# Patient Record
Sex: Female | Born: 1980
Health system: Southern US, Community
[De-identification: ages and names within clinical notes are randomized; demographics above are authoritative.]

## PROBLEM LIST (undated history)

## (undated) DIAGNOSIS — B999 Unspecified infectious disease: Secondary | ICD-10-CM

## (undated) DIAGNOSIS — R87629 Unspecified abnormal cytological findings in specimens from vagina: Secondary | ICD-10-CM

## (undated) DIAGNOSIS — O09529 Supervision of elderly multigravida, unspecified trimester: Secondary | ICD-10-CM

## (undated) HISTORY — PX: WISDOM TOOTH EXTRACTION: SHX21

## (undated) HISTORY — PX: COLPOSCOPY: SHX161

---

## 2003-12-06 ENCOUNTER — Other Ambulatory Visit: Admission: RE | Admit: 2003-12-06 | Discharge: 2003-12-06 | Payer: Self-pay | Admitting: Obstetrics and Gynecology

## 2005-01-07 ENCOUNTER — Other Ambulatory Visit: Admission: RE | Admit: 2005-01-07 | Discharge: 2005-01-07 | Payer: Self-pay | Admitting: Obstetrics & Gynecology

## 2014-11-15 ENCOUNTER — Encounter (HOSPITAL_COMMUNITY): Payer: Self-pay | Admitting: *Deleted

## 2014-11-15 ENCOUNTER — Encounter (HOSPITAL_COMMUNITY)
Admission: AD | Disposition: A | Discharge status: Home / Self Care | Payer: Self-pay | Source: Ambulatory Visit | Attending: Obstetrics and Gynecology

## 2014-11-15 ENCOUNTER — Inpatient Hospital Stay (HOSPITAL_COMMUNITY): Payer: 59

## 2014-11-15 ENCOUNTER — Inpatient Hospital Stay (HOSPITAL_COMMUNITY)
Admission: AD | Admit: 2014-11-15 | Discharge: 2014-11-19 | DRG: 766 | Disposition: A | Discharge status: Home / Self Care | Payer: 59 | Source: Ambulatory Visit | Attending: Obstetrics and Gynecology | Admitting: Obstetrics and Gynecology

## 2014-11-15 DIAGNOSIS — O480 Post-term pregnancy: Principal | ICD-10-CM | POA: Diagnosis present

## 2014-11-15 DIAGNOSIS — Z87891 Personal history of nicotine dependence: Secondary | ICD-10-CM

## 2014-11-15 DIAGNOSIS — Z8249 Family history of ischemic heart disease and other diseases of the circulatory system: Secondary | ICD-10-CM

## 2014-11-15 DIAGNOSIS — Z3A40 40 weeks gestation of pregnancy: Secondary | ICD-10-CM

## 2014-11-15 DIAGNOSIS — Z98891 History of uterine scar from previous surgery: Secondary | ICD-10-CM

## 2014-11-15 DIAGNOSIS — Z825 Family history of asthma and other chronic lower respiratory diseases: Secondary | ICD-10-CM

## 2014-11-15 HISTORY — DX: Unspecified abnormal cytological findings in specimens from vagina: R87.629

## 2014-11-15 HISTORY — DX: Unspecified infectious disease: B99.9

## 2014-11-15 LAB — CBC
HEMATOCRIT: 39 % (ref 36.0–46.0)
Hemoglobin: 13.4 g/dL (ref 12.0–15.0)
MCH: 29.2 pg (ref 26.0–34.0)
MCHC: 34.4 g/dL (ref 30.0–36.0)
MCV: 85 fL (ref 78.0–100.0)
Platelets: 298 10*3/uL (ref 150–400)
RBC: 4.59 MIL/uL (ref 3.87–5.11)
RDW: 14.6 % (ref 11.5–15.5)
WBC: 10.8 10*3/uL — AB (ref 4.0–10.5)

## 2014-11-15 LAB — TYPE AND SCREEN
ABO/RH(D): O POS
ANTIBODY SCREEN: NEGATIVE

## 2014-11-15 LAB — ABO/RH: ABO/RH(D): O POS

## 2014-11-15 SURGERY — Surgical Case
Anesthesia: Spinal

## 2014-11-15 MED ORDER — ZOLPIDEM TARTRATE 5 MG PO TABS: 5.0000 mg | ORAL_TABLET | Freq: Every evening | ORAL | Status: DC | PRN

## 2014-11-15 MED ORDER — SIMETHICONE 80 MG PO CHEW
80.0000 mg | CHEWABLE_TABLET | ORAL | Status: DC | PRN
  Administered 2014-11-17: 80 mg via ORAL
  Filled 2014-11-15: qty 1

## 2014-11-15 MED ORDER — LACTATED RINGERS IV SOLN
INTRAVENOUS | Status: DC | PRN
  Administered 2014-11-15: 18:00:00 via INTRAVENOUS

## 2014-11-15 MED ORDER — PRENATAL MULTIVITAMIN CH
1.0000 | ORAL_TABLET | Freq: Every day | ORAL | Status: DC
  Administered 2014-11-16 – 2014-11-19 (×2): 1 via ORAL
  Filled 2014-11-15 (×4): qty 1

## 2014-11-15 MED ORDER — NALBUPHINE HCL 10 MG/ML IJ SOLN: 5.0000 mg | INTRAMUSCULAR | Status: DC | PRN

## 2014-11-15 MED ORDER — SCOPOLAMINE 1 MG/3DAYS TD PT72
MEDICATED_PATCH | TRANSDERMAL | Status: AC
  Filled 2014-11-15: qty 1

## 2014-11-15 MED ORDER — NALBUPHINE HCL 10 MG/ML IJ SOLN: 5.0000 mg | Freq: Once | INTRAMUSCULAR | Status: DC | PRN

## 2014-11-15 MED ORDER — FAMOTIDINE IN NACL 20-0.9 MG/50ML-% IV SOLN
20.0000 mg | Freq: Once | INTRAVENOUS | Status: AC
  Administered 2014-11-15: 20 mg via INTRAVENOUS

## 2014-11-15 MED ORDER — OXYTOCIN 10 UNIT/ML IJ SOLN
INTRAMUSCULAR | Status: AC
  Filled 2014-11-15: qty 4

## 2014-11-15 MED ORDER — ONDANSETRON HCL 4 MG/2ML IJ SOLN
INTRAMUSCULAR | Status: AC
  Filled 2014-11-15: qty 2

## 2014-11-15 MED ORDER — PHENYLEPHRINE 40 MCG/ML (10ML) SYRINGE FOR IV PUSH (FOR BLOOD PRESSURE SUPPORT)
PREFILLED_SYRINGE | INTRAVENOUS | Status: AC
  Filled 2014-11-15: qty 10

## 2014-11-15 MED ORDER — MEPERIDINE HCL 25 MG/ML IJ SOLN: 6.2500 mg | INTRAMUSCULAR | Status: DC | PRN

## 2014-11-15 MED ORDER — MORPHINE SULFATE (PF) 0.5 MG/ML IJ SOLN
INTRAMUSCULAR | Status: DC | PRN
  Administered 2014-11-15: .2 mg via INTRATHECAL

## 2014-11-15 MED ORDER — NALOXONE HCL 0.4 MG/ML IJ SOLN: 0.4000 mg | INTRAMUSCULAR | Status: DC | PRN

## 2014-11-15 MED ORDER — DIBUCAINE 1 % RE OINT: 1.0000 "application " | TOPICAL_OINTMENT | RECTAL | Status: DC | PRN

## 2014-11-15 MED ORDER — DIPHENHYDRAMINE HCL 25 MG PO CAPS
25.0000 mg | ORAL_CAPSULE | ORAL | Status: DC | PRN
Start: 2014-11-15 — End: 2014-11-19

## 2014-11-15 MED ORDER — CEFAZOLIN SODIUM-DEXTROSE 2-3 GM-% IV SOLR
2.0000 g | INTRAVENOUS | Status: AC
  Administered 2014-11-15: 2 g via INTRAVENOUS

## 2014-11-15 MED ORDER — LANOLIN HYDROUS EX OINT: 1.0000 "application " | TOPICAL_OINTMENT | CUTANEOUS | Status: DC | PRN

## 2014-11-15 MED ORDER — MORPHINE SULFATE (PF) 0.5 MG/ML IJ SOLN: INTRAMUSCULAR | Status: DC | PRN

## 2014-11-15 MED ORDER — PHENYLEPHRINE HCL 10 MG/ML IJ SOLN
INTRAMUSCULAR | Status: DC | PRN
Start: 2014-11-15 — End: 2014-11-15
  Administered 2014-11-15: 40 ug via INTRAVENOUS
  Administered 2014-11-15 (×2): 80 ug via INTRAVENOUS

## 2014-11-15 MED ORDER — ACETAMINOPHEN 325 MG PO TABS: 650.0000 mg | ORAL_TABLET | ORAL | Status: DC | PRN

## 2014-11-15 MED ORDER — KETOROLAC TROMETHAMINE 30 MG/ML IJ SOLN
30.0000 mg | Freq: Four times a day (QID) | INTRAMUSCULAR | Status: DC | PRN
  Administered 2014-11-15: 30 mg via INTRAMUSCULAR

## 2014-11-15 MED ORDER — PROMETHAZINE HCL 25 MG/ML IJ SOLN: 6.2500 mg | INTRAMUSCULAR | Status: DC | PRN

## 2014-11-15 MED ORDER — OXYTOCIN 10 UNIT/ML IJ SOLN
40.0000 [IU] | INTRAVENOUS | Status: DC | PRN
  Administered 2014-11-15: 40 [IU] via INTRAVENOUS

## 2014-11-15 MED ORDER — TETANUS-DIPHTH-ACELL PERTUSSIS 5-2.5-18.5 LF-MCG/0.5 IM SUSP: 0.5000 mL | Freq: Once | INTRAMUSCULAR | Status: DC

## 2014-11-15 MED ORDER — WITCH HAZEL-GLYCERIN EX PADS
1.0000 | MEDICATED_PAD | CUTANEOUS | Status: DC | PRN
Start: 2014-11-15 — End: 2014-11-19

## 2014-11-15 MED ORDER — ACETAMINOPHEN 500 MG PO TABS: 1000.0000 mg | ORAL_TABLET | Freq: Four times a day (QID) | ORAL | Status: AC

## 2014-11-15 MED ORDER — FENTANYL CITRATE (PF) 100 MCG/2ML IJ SOLN
INTRAMUSCULAR | Status: AC
  Filled 2014-11-15: qty 4

## 2014-11-15 MED ORDER — DIPHENHYDRAMINE HCL 50 MG/ML IJ SOLN: 12.5000 mg | INTRAMUSCULAR | Status: DC | PRN

## 2014-11-15 MED ORDER — PHENYLEPHRINE 8 MG IN D5W 100 ML (0.08MG/ML) PREMIX OPTIME
INJECTION | INTRAVENOUS | Status: DC | PRN
  Administered 2014-11-15: 50 ug/min via INTRAVENOUS

## 2014-11-15 MED ORDER — LACTATED RINGERS IV SOLN: INTRAVENOUS | Status: DC

## 2014-11-15 MED ORDER — NALOXONE HCL 1 MG/ML IJ SOLN: 1.0000 ug/kg/h | INTRAVENOUS | Status: DC | PRN

## 2014-11-15 MED ORDER — MORPHINE SULFATE (PF) 0.5 MG/ML IJ SOLN
INTRAMUSCULAR | Status: AC
  Filled 2014-11-15: qty 100

## 2014-11-15 MED ORDER — LACTATED RINGERS IV BOLUS (SEPSIS): 1000.0000 mL | Freq: Once | INTRAVENOUS | Status: DC

## 2014-11-15 MED ORDER — CEFAZOLIN SODIUM-DEXTROSE 2-3 GM-% IV SOLR
INTRAVENOUS | Status: AC
  Filled 2014-11-15: qty 50

## 2014-11-15 MED ORDER — ONDANSETRON HCL 4 MG/2ML IJ SOLN
INTRAMUSCULAR | Status: DC | PRN
  Administered 2014-11-15: 4 mg via INTRAVENOUS

## 2014-11-15 MED ORDER — SCOPOLAMINE 1 MG/3DAYS TD PT72: 1.0000 | MEDICATED_PATCH | Freq: Once | TRANSDERMAL | Status: DC

## 2014-11-15 MED ORDER — ONDANSETRON HCL 4 MG/2ML IJ SOLN: 4.0000 mg | Freq: Three times a day (TID) | INTRAMUSCULAR | Status: DC | PRN

## 2014-11-15 MED ORDER — MAGNESIUM HYDROXIDE 400 MG/5ML PO SUSP: 30.0000 mL | ORAL | Status: DC | PRN

## 2014-11-15 MED ORDER — OXYTOCIN 40 UNITS IN LACTATED RINGERS INFUSION - SIMPLE MED: 62.5000 mL/h | INTRAVENOUS | Status: AC

## 2014-11-15 MED ORDER — SODIUM CHLORIDE 0.9 % IJ SOLN: 3.0000 mL | INTRAMUSCULAR | Status: DC | PRN

## 2014-11-15 MED ORDER — LACTATED RINGERS IV SOLN
INTRAVENOUS | Status: DC
  Administered 2014-11-15 (×2): via INTRAVENOUS

## 2014-11-15 MED ORDER — FENTANYL CITRATE (PF) 100 MCG/2ML IJ SOLN: 25.0000 ug | INTRAMUSCULAR | Status: DC | PRN

## 2014-11-15 MED ORDER — TERBUTALINE SULFATE 1 MG/ML IJ SOLN
0.2500 mg | Freq: Once | INTRAMUSCULAR | Status: AC
  Administered 2014-11-15: 0.25 mg via SUBCUTANEOUS

## 2014-11-15 MED ORDER — OXYCODONE-ACETAMINOPHEN 5-325 MG PO TABS
1.0000 | ORAL_TABLET | ORAL | Status: DC | PRN
  Administered 2014-11-16: 2 via ORAL
  Administered 2014-11-16 (×2): 1 via ORAL
  Administered 2014-11-17: 2 via ORAL
  Administered 2014-11-17: 1 via ORAL
  Administered 2014-11-17 – 2014-11-18 (×2): 2 via ORAL
  Administered 2014-11-18 – 2014-11-19 (×2): 1 via ORAL
  Filled 2014-11-15 (×2): qty 1
  Filled 2014-11-15: qty 2
  Filled 2014-11-15: qty 1
  Filled 2014-11-15: qty 2
  Filled 2014-11-15: qty 1
  Filled 2014-11-15: qty 2
  Filled 2014-11-15: qty 1
  Filled 2014-11-15: qty 2

## 2014-11-15 MED ORDER — MEASLES, MUMPS & RUBELLA VAC ~~LOC~~ INJ: 0.5000 mL | INJECTION | Freq: Once | SUBCUTANEOUS | Status: DC

## 2014-11-15 MED ORDER — FENTANYL CITRATE (PF) 100 MCG/2ML IJ SOLN
INTRAMUSCULAR | Status: DC | PRN
  Administered 2014-11-15: 20 ug via INTRATHECAL

## 2014-11-15 MED ORDER — KETOROLAC TROMETHAMINE 30 MG/ML IJ SOLN: 30.0000 mg | Freq: Four times a day (QID) | INTRAMUSCULAR | Status: DC | PRN

## 2014-11-15 MED ORDER — KETOROLAC TROMETHAMINE 30 MG/ML IJ SOLN
INTRAMUSCULAR | Status: AC
  Administered 2014-11-15: 30 mg via INTRAMUSCULAR
  Filled 2014-11-15: qty 1

## 2014-11-15 MED ORDER — BUPIVACAINE IN DEXTROSE 0.75-8.25 % IT SOLN
INTRATHECAL | Status: DC | PRN
  Administered 2014-11-15: 1.4 mL via INTRATHECAL

## 2014-11-15 MED ORDER — SODIUM CHLORIDE 0.9 % IR SOLN
Status: DC | PRN
  Administered 2014-11-15: 1000 mL

## 2014-11-15 MED ORDER — FENTANYL CITRATE (PF) 100 MCG/2ML IJ SOLN: INTRAMUSCULAR | Status: DC | PRN

## 2014-11-15 MED ORDER — PHENYLEPHRINE 8 MG IN D5W 100 ML (0.08MG/ML) PREMIX OPTIME
INJECTION | INTRAVENOUS | Status: AC
  Filled 2014-11-15: qty 100

## 2014-11-15 MED ORDER — CITRIC ACID-SODIUM CITRATE 334-500 MG/5ML PO SOLN
ORAL | Status: AC
  Administered 2014-11-15: 30 mL via ORAL
  Filled 2014-11-15: qty 15

## 2014-11-15 MED ORDER — CITRIC ACID-SODIUM CITRATE 334-500 MG/5ML PO SOLN
30.0000 mL | Freq: Once | ORAL | Status: AC
  Administered 2014-11-15: 30 mL via ORAL

## 2014-11-15 MED ORDER — TERBUTALINE SULFATE 1 MG/ML IJ SOLN
INTRAMUSCULAR | Status: AC
  Filled 2014-11-15: qty 1

## 2014-11-15 MED ORDER — DIPHENHYDRAMINE HCL 25 MG PO CAPS: 25.0000 mg | ORAL_CAPSULE | Freq: Four times a day (QID) | ORAL | Status: DC | PRN

## 2014-11-15 MED ORDER — SENNOSIDES-DOCUSATE SODIUM 8.6-50 MG PO TABS
2.0000 | ORAL_TABLET | ORAL | Status: DC
  Administered 2014-11-16 – 2014-11-18 (×4): 2 via ORAL
  Filled 2014-11-15 (×4): qty 2

## 2014-11-15 MED ORDER — IBUPROFEN 600 MG PO TABS
600.0000 mg | ORAL_TABLET | Freq: Four times a day (QID) | ORAL | Status: DC
  Administered 2014-11-16 – 2014-11-19 (×15): 600 mg via ORAL
  Filled 2014-11-15 (×15): qty 1

## 2014-11-15 MED ORDER — FAMOTIDINE IN NACL 20-0.9 MG/50ML-% IV SOLN
INTRAVENOUS | Status: AC
  Administered 2014-11-15: 20 mg via INTRAVENOUS
  Filled 2014-11-15: qty 50

## 2014-11-15 MED ORDER — SCOPOLAMINE 1 MG/3DAYS TD PT72
MEDICATED_PATCH | TRANSDERMAL | Status: DC | PRN
  Administered 2014-11-15: 1 via TRANSDERMAL

## 2014-11-15 MED ORDER — MENTHOL 3 MG MT LOZG: 1.0000 | LOZENGE | OROMUCOSAL | Status: DC | PRN

## 2014-11-15 SURGICAL SUPPLY — 39 items
APL SKNCLS STERI-STRIP NONHPOA (GAUZE/BANDAGES/DRESSINGS) ×1
BENZOIN TINCTURE PRP APPL 2/3 (GAUZE/BANDAGES/DRESSINGS) ×2 IMPLANT
CLAMP CORD UMBIL (MISCELLANEOUS) IMPLANT
CLOSURE STERI STRIP 1/2 X4 (GAUZE/BANDAGES/DRESSINGS) ×2 IMPLANT
CLOTH BEACON ORANGE TIMEOUT ST (SAFETY) ×3 IMPLANT
CONTAINER PREFILL 10% NBF 15ML (MISCELLANEOUS) IMPLANT
DRAPE SHEET LG 3/4 BI-LAMINATE (DRAPES) IMPLANT
DRSG OPSITE POSTOP 4X10 (GAUZE/BANDAGES/DRESSINGS) ×3 IMPLANT
DURAPREP 26ML APPLICATOR (WOUND CARE) ×3 IMPLANT
ELECT REM PT RETURN 9FT ADLT (ELECTROSURGICAL) ×3
ELECTRODE REM PT RTRN 9FT ADLT (ELECTROSURGICAL) ×1 IMPLANT
EXTRACTOR VACUUM KIWI (MISCELLANEOUS) IMPLANT
EXTRACTOR VACUUM M CUP 4 TUBE (SUCTIONS) IMPLANT
EXTRACTOR VACUUM M CUP 4' TUBE (SUCTIONS)
GLOVE ORTHO TXT STRL SZ7.5 (GLOVE) ×3 IMPLANT
GOWN STRL REUS W/TWL LRG LVL3 (GOWN DISPOSABLE) ×6 IMPLANT
KIT ABG SYR 3ML LUER SLIP (SYRINGE) IMPLANT
NEEDLE HYPO 25X5/8 SAFETYGLIDE (NEEDLE) ×3 IMPLANT
NS IRRIG 1000ML POUR BTL (IV SOLUTION) ×3 IMPLANT
PACK C SECTION WH (CUSTOM PROCEDURE TRAY) ×3 IMPLANT
PAD OB MATERNITY 4.3X12.25 (PERSONAL CARE ITEMS) ×3 IMPLANT
PENCIL SMOKE EVAC W/HOLSTER (ELECTROSURGICAL) ×3 IMPLANT
RETRACTOR WND ALEXIS 25 LRG (MISCELLANEOUS) IMPLANT
RTRCTR C-SECT PINK 25CM LRG (MISCELLANEOUS) ×3 IMPLANT
RTRCTR WOUND ALEXIS 25CM LRG (MISCELLANEOUS) ×3
STRIP CLOSURE SKIN 1/2X4 (GAUZE/BANDAGES/DRESSINGS) ×1 IMPLANT
SUT CHROMIC 1 CTX 36 (SUTURE) ×6 IMPLANT
SUT PLAIN 0 NONE (SUTURE) IMPLANT
SUT PLAIN 2 0 (SUTURE) ×3
SUT PLAIN 2 0 XLH (SUTURE) IMPLANT
SUT PLAIN ABS 2-0 CT1 27XMFL (SUTURE) IMPLANT
SUT VIC AB 0 CT1 27 (SUTURE) ×6
SUT VIC AB 0 CT1 27XBRD ANBCTR (SUTURE) ×2 IMPLANT
SUT VIC AB 2-0 CT1 (SUTURE) ×3 IMPLANT
SUT VIC AB 2-0 CT1 27 (SUTURE) ×3
SUT VIC AB 2-0 CT1 TAPERPNT 27 (SUTURE) ×1 IMPLANT
SUT VIC AB 4-0 KS 27 (SUTURE) ×3 IMPLANT
TOWEL OR 17X24 6PK STRL BLUE (TOWEL DISPOSABLE) ×3 IMPLANT
TRAY FOLEY CATH SILVER 14FR (SET/KITS/TRAYS/PACK) ×3 IMPLANT

## 2014-11-15 NOTE — H&P (Signed)
Joseph C JUNKTora KindredR is a 34 y.o. female, G1P0, EGA [redacted] weeks with EDC 10-6 presenting for eval of FHR decels in the ovviced.  On eval in MAU, irreg ctx, variable, late and a prolonged decel.  Prenatal care uncomplicated.  Maternal Medical History:  Reason for admission: Contractions.   Contractions: Frequency: irregular.   Perceived severity is moderate.    Fetal activity: Perceived fetal activity is normal.    Prenatal complications: no prenatal complications   OB History    Gravida Para Term Preterm AB TAB SAB Ectopic Multiple Living   1              Past Medical History  Diagnosis Date  . Infection     UTI  . Vaginal Pap smear, abnormal     colpo, ok since   Past Surgical History  Procedure Laterality Date  . No past surgeries    . Colposcopy     Family History: family history includes Asthma in her mother; Heart disease in her maternal grandfather and maternal grandmother. Social History:  reports that she has quit smoking. Her smoking use included Cigarettes. She has never used smokeless tobacco. She reports that she drinks alcohol. She reports that she does not use illicit drugs.   Prenatal Transfer Tool  Maternal Diabetes: No Genetic Screening: Normal Maternal Ultrasounds/Referrals: Normal Fetal Ultrasounds or other Referrals:  None Maternal Substance Abuse:  No Significant Maternal Medications:  None Significant Maternal Lab Results:  Lab values include: Group B Strep negative Other Comments:  None  Review of Systems  Respiratory: Negative.   Cardiovascular: Negative.       Blood pressure 108/75, pulse 84, temperature 97.8 F (36.6 C), temperature source Oral, resp. rate 18, height 5\' 3"  (1.6 m), weight 82.192 kg (181 lb 3.2 oz), SpO2 100 %. Maternal Exam:  Uterine Assessment: Contraction strength is mild.  Contraction frequency is irregular.   Abdomen: Patient reports no abdominal tenderness. Fetal presentation: vertex  Introitus: Normal vulva. Normal  vagina.    Fetal Exam Fetal Monitor Review: Mode: ultrasound.   Baseline rate: 150.  Variability: moderate (6-25 bpm).   Pattern: variable decelerations, late decelerations and prolonged decelerations.    Fetal State Assessment: Category II - tracings are indeterminate.     Physical Exam  Vitals reviewed. Constitutional: She appears well-developed and well-nourished.  Cardiovascular: Normal rate, regular rhythm and normal heart sounds.   No murmur heard. Respiratory: Effort normal and breath sounds normal. No respiratory distress. She has no wheezes.  GI: Soft.    Prenatal labs: ABO, Rh:  O pos Antibody:  neg Rubella:  Immune RPR:   NR HBsAg:   Neg HIV:   NR GBS:   Neg  Assessment/Plan: IUP at 41 weeks with persistent FHR decels, Cat II, remote from delivery.  Discussed situation, discussed c-section procedure and risks.  Will proceed with urgent c-section, OR is aware.   Bayan Hedstrom D 11/15/2014, 4:52 PM

## 2014-11-15 NOTE — MAU Note (Signed)
Sent from office, had 2 decels there. Had some dry blood  Earlier, small amt of mucous.

## 2014-11-15 NOTE — MAU Provider Note (Signed)
Chief Complaint:  decls in FH in office    First Provider Initiated Contact with Patient 11/15/14 1649      HPI: Wendy Harding is a 34 y.o. G1P0 at [redacted]w[redacted]d who was sent to maternity admissions for FHR monitoring. Had 2 decels in office. NST performed for decreased fetal movement. Sent to [facility] for direct admit to [facility], but the is not a bed available.   Mild contractions. Denies leakage of fluid or vaginal bleeding.  Pregnancy course: Ultrasound performed for measuring size less than dates at 38 weeks. Estimated fetal weight 20th percentile. AFI 9 cm.   Past Medical History: Past Medical History  Diagnosis Date  . Infection     UTI  . Vaginal Pap smear, abnormal     colpo, ok since    Past obstetric history: OB History  Gravida Para Term Preterm AB SAB TAB Ectopic Multiple Living  1             # Outcome Date GA Lbr Len/2nd Weight Sex Delivery Anes PTL Lv  1 Current               Past Surgical History: Past Surgical History  Procedure Laterality Date  . No past surgeries    . Colposcopy       Family History: Family History  Problem Relation Age of Onset  . Asthma Mother   . Heart disease Maternal Grandmother   . Heart disease Maternal Grandfather     Social History: Social History  Substance Use Topics  . Smoking status: Former Smoker    Types: Cigarettes  . Smokeless tobacco: Never Used     Comment: 2014  . Alcohol Use: Yes     Comment: none with preg    Allergies: No Known Allergies  Meds:  Prescriptions prior to admission  Medication Sig Dispense Refill Last Dose  . Prenatal Vit-Fe Fumarate-FA (PRENATAL MULTIVITAMIN) TABS tablet Take 1 tablet by mouth daily at 12 noon.   11/15/2014 at Unknown time    I have reviewed patient's Past Medical Hx, Surgical Hx, Family Hx, Social Hx, medications and allergies.   ROS:  Review of Systems  Gastrointestinal: Positive for abdominal pain (mild contractions only).  Genitourinary: Negative for vaginal  bleeding and vaginal discharge.    Physical Exam   Patient Vitals for the past 24 hrs:  BP Temp Temp src Pulse Resp SpO2 Height Weight  11/15/14 1631 108/75 mmHg 97.8 F (36.6 C) Oral 84 18 100 %  (1.6 m) 181 lb 3.2 oz (82.192 kg)   Constitutional: Well-developed, well-nourished female in no acute distress.  Cardiovascular: normal rate Respiratory: normal effort GI: Abd soft, non-tender, gravid appropriate for gestational age. Palpate soft between contractions. Neurologic: Alert and oriented x 4.  GU: Cervix 1/long/-3. Scant brown mucus on glove.    FHT:  Baseline 150 , min variability, accelerations present, mild variable decelerations Contractions: q 4 mins   Labs: Results for orders placed or performed during the hospital encounter of 11/15/14 (from the past 24 hour(s))  CBC     Status: Abnormal   Collection Time: 11/15/14  4:30 PM  Result Value Ref Range   WBC 10.8 (H) 4.0 - 10.5 K/uL   RBC 4.59 3.87 - 5.11 MIL/uL   Hemoglobin 13.4 12.0 - 15.0 g/dL   HCT 16.1 09.6 - 04.5 %   MCV 85.0 78.0 - 100.0 fL   MCH 29.2 26.0 - 34.0 pg   MCHC 34.4 30.0 - 36.0 g/dL  RDW 14.6 11.5 - 15.5 %   Platelets 298 150 - 400 K/uL  Type and screen [facility]     Status: None (Preliminary result)   Collection Time: 11/15/14  4:30 PM  Result Value Ref Range   ABO/RH(D) O POS    Antibody Screen PENDING    Sample Expiration 11/18/2014     Imaging:  No results found.  MAU Course: Prolonged deceleration. IV bolus, multiple position changes, oxygen, terbutaline given. Dr. Jackelyn KnifeMeisinger and Dr. Macon LargeAnyanwu called. Dr. Jackelyn KnifeMeisinger coming to hospital. Dr. Macon LargeAnyanwu bedside in case stat C-section needed, but fetal heart rate was back up to 90s and continued to rise. Dr. Jackelyn KnifeMeisinger bedside soon after.  MDM: 34 year old female at 40 weeks and 6 days with multiple decelerations including 17 minute prolonged decel. Patient remote from delivery.   Assessment: Fetal heart rate  decelerations  Plan: Prep OR for urgent C-section.  Rochester[place]ia Grabiela Harding, CNM 11/15/2014 5:05 PM

## 2014-11-15 NOTE — Transfer of Care (Signed)
Immediate Anesthesia Transfer of Care Note  Patient: Wendy Harding  Procedure(s) Performed: Procedure(s): CESAREAN SECTION (N/A)  Patient Location: PACU  Anesthesia Type:Spinal  Level of Consciousness: awake, alert  and patient cooperative  Airway & Oxygen Therapy: Patient Spontanous Breathing  Post-op Assessment: Report given to RN and Post -op Vital signs reviewed and stable  Post vital signs: Reviewed and stable  Last Vitals:  Filed Vitals:   11/15/14 1631  BP: 108/75  Pulse: 84  Temp: 36.6 C  Resp: 18    Complications: No apparent anesthesia complications

## 2014-11-15 NOTE — Op Note (Signed)
Preoperative diagnosis: Intrauterine pregnancy at 41 weeks, FHR decels Postoperative diagnosis: Same, thick meconium Procedure: Primary low transverse cesarean section without extensions Surgeon: Lavina Hammanodd Savvy Peeters M.D. Anesthesia: Spinal  Findings: Patient had normal gravid anatomy and delivered a viable female infant with Apgars and weight pending, arterial cord pH pending Estimated blood loss: 800 cc Specimens: Placenta sent for routine pathology Complications: None  Procedure in detail: The patient was taken to the operating room and placed in the sitting position. Dr. Berneice HeinrichManny instilled spinal anesthesia.  She was then placed in the dorsosupine position with left tilt. Abdomen was then prepped and draped in the usual sterile fashion, and a foley catheter was inserted. The level of her anesthesia was found to be adequate. Abdomen was entered via a standard Pfannenstiel incision. Once the peritoneal cavity was entered the Alexis disposable self-retaining retractor was placed and good visualization was achieved. A 4 cm transverse incision was then made in the lower uterine segment pushing the bladder inferior. Once the uterine cavity was entered the incision was extended digitally. Membranes were ruptured revealing thick meconium.  The fetal vertex was grasped and delivered through the incision atraumatically, nuchal cord x 1 reduced. Mouth and nares were suctioned. The remainder of the infant then delivered atraumatically. Cord was doubly clamped and cut and the infant handed to the awaiting pediatric team. Cord blood and arterial cord pH were obtained. The placenta delivered spontaneously. Uterus was wiped dry with clean lap pad and all clots and debris were removed. Uterine incision was inspected and found to be free of extensions. Uterine incision was closed in 2 layers with running locking #1 Chromic for the first layer, an imbricating layer with #1 Chromic for the second layer. Tubes and ovaries were  inspected and found to be normal. Uterine incision was inspected and found to be hemostatic. Bleeding from serosal edges was controlled with electrocautery. The Alexis retractor was removed. Subfascial space was irrigated and made hemostatic with electrocautery. Peritoneum was closed with 2-0 Vicryl.  Fascia was closed in running fashion starting at both ends and meeting in the middle with 0 Vicryl. Subcutaneous tissue was then irrigated and made hemostatic with electrocautery, then closed with running 2-0 plain gut. Skin was closed with running 4-0 Vicryl subcuticular suture followed by steri-strips and a sterile dressing. Patient tolerated the procedure well and was taken to the recovery in stable condition. Counts were correct x2, she received Ancef 2 g IV at the beginning of the procedure and she had PAS hose on throughout the procedure.

## 2014-11-15 NOTE — Anesthesia Preprocedure Evaluation (Signed)
Anesthesia Evaluation  Patient identified by MRN, date of birth, ID band Patient awake and Patient confused    Reviewed: Allergy & Precautions, H&P , NPO status , Patient's Chart, lab work & pertinent test results  Airway Mallampati: II       Dental   Pulmonary former smoker,    Pulmonary exam normal breath sounds clear to auscultation       Cardiovascular Exercise Tolerance: Good Normal cardiovascular exam Rhythm:regular Rate:Normal     Neuro/Psych    GI/Hepatic   Endo/Other    Renal/GU      Musculoskeletal   Abdominal   Peds  Hematology   Anesthesia Other Findings   Reproductive/Obstetrics (+) Pregnancy                             Anesthesia Physical Anesthesia Plan  ASA: II and emergent  Anesthesia Plan: Spinal   Post-op Pain Management:    Induction:   Airway Management Planned:   Additional Equipment:   Intra-op Plan:   Post-operative Plan:   Informed Consent:   Plan Discussed with:   Anesthesia Plan Comments:         Anesthesia Quick Evaluation

## 2014-11-15 NOTE — Anesthesia Postprocedure Evaluation (Signed)
  Anesthesia Post-op Note  Patient: Wendy Harding  Procedure(s) Performed: Procedure(s): CESAREAN SECTION (N/A)  Patient Location: PACU  Anesthesia Type:Spinal  Level of Consciousness: awake and alert   Airway and Oxygen Therapy: Patient Spontanous Breathing  Post-op Pain: mild  Post-op Assessment: Post-op Vital signs reviewed, Patient's Cardiovascular Status Stable, Respiratory Function Stable, Patent Airway and No signs of Nausea or vomiting              Post-op Vital Signs: Reviewed and stable  Last Vitals:  Filed Vitals:   11/15/14 1900  BP: 106/55  Pulse: 73  Temp:   Resp: 17    Complications: No apparent anesthesia complications

## 2014-11-15 NOTE — Anesthesia Procedure Notes (Addendum)
Spinal Patient location during procedure: OR Start time: 11/15/2014 5:15 PM End time: 11/15/2014 5:26 PM Staffing Anesthesiologist: Sebastian AcheMANNY, Juliane Guest Preanesthetic Checklist Completed: patient identified, site marked, surgical consent, pre-op evaluation, timeout performed, IV checked, risks and benefits discussed and monitors and equipment checked Spinal Block Patient position: sitting Prep: site prepped and draped and DuraPrep Patient monitoring: heart rate, continuous pulse ox and blood pressure Approach: midline Location: L3-4 Injection technique: single-shot Needle Needle type: Pencil-Tip  Needle gauge: 24 G Needle length: 10 cm Needle insertion depth: 6 cm Assessment Sensory level: T4 Additional Notes No complications

## 2014-11-16 ENCOUNTER — Encounter (HOSPITAL_COMMUNITY): Payer: Self-pay | Admitting: Obstetrics and Gynecology

## 2014-11-16 LAB — CBC
HCT: 32.9 % — ABNORMAL LOW (ref 36.0–46.0)
HEMOGLOBIN: 11.1 g/dL — AB (ref 12.0–15.0)
MCH: 28.5 pg (ref 26.0–34.0)
MCHC: 33.7 g/dL (ref 30.0–36.0)
MCV: 84.6 fL (ref 78.0–100.0)
PLATELETS: 257 10*3/uL (ref 150–400)
RBC: 3.89 MIL/uL (ref 3.87–5.11)
RDW: 14.7 % (ref 11.5–15.5)
WBC: 10.5 10*3/uL (ref 4.0–10.5)

## 2014-11-16 LAB — RPR: RPR Ser Ql: NONREACTIVE

## 2014-11-16 NOTE — Lactation Note (Signed)
This note was copied from the chart of Wendy Prescilla Soursmanda Sebesta. Lactation Consultation Note  Patient Name: Wendy Harding JXBJY'NToday's Date: 11/16/2014 Reason for consult: Initial assessment;NICU baby;Hyperbilirubinemia Baby transferred to NICU for hyperbilirubinemia. Mom has been set up with DEBP. Advised to pump every 3 hours for 15 minutes on preemie setting, followed by 5 minutes of hand expression. Breast milk storage reviewed with Mom. NICU booklet and Lactation brochure left for review. Advised of OP services and support group. Demonstrated hand expression at this visit, no colostrum obtained at this visit.   Maternal Data Has patient been taught Hand Expression?: Yes Does the patient have breastfeeding experience prior to this delivery?: No  Feeding Feeding Type: Bottle Fed - Formula Nipple Type: Slow - flow Length of feed: 10 min  LATCH Score/Interventions                      Lactation Tools Discussed/Used Tools: Pump Breast pump type: Double-Electric Breast Pump WIC Program: No   Consult Status Consult Status: Follow-up Date: 11/17/14 Follow-up type: In-patient    Wendy Harding, Wendy Harding 11/16/2014, 6:10 PM

## 2014-11-16 NOTE — Progress Notes (Signed)
Subjective: Postpartum Day #1: Cesarean Delivery Patient reports incisional pain and tolerating PO.    Objective: Vital signs in last 24 hours: Temp:  [95.8 F (35.4 C)-98.5 F (36.9 C)] 98.2 F (36.8 C) (10/14 0411) Pulse Rate:  [53-84] 57 (10/14 0421) Resp:  [16-29] 18 (10/14 0413) BP: (81-111)/(54-75) 88/57 mmHg (10/14 0421) SpO2:  [93 %-100 %] 97 % (10/14 0411) Weight:  [82.192 kg (181 lb 3.2 oz)] 82.192 kg (181 lb 3.2 oz) (10/13 1631)  Physical Exam:  General: alert Lochia: appropriate Uterine Fundus: firm Incision: dressing C/D/I   Recent Labs  11/15/14 1630 11/16/14 0535  HGB 13.4 11.1*  HCT 39.0 32.9*    Assessment/Plan: Status post Cesarean section. Doing well postoperatively.  Continue current care, ambulate, d/c foley.  Maridel Pixler D 11/16/2014, 8:08 AM

## 2014-11-16 NOTE — Anesthesia Postprocedure Evaluation (Signed)
  Anesthesia Post-op Note  Patient: Wendy Harding  Procedure(s) Performed: Procedure(s): CESAREAN SECTION (N/A)  Patient Location: Mother/Baby  Anesthesia Type:Spinal  Level of Consciousness: awake  Airway and Oxygen Therapy: Patient Spontanous Breathing  Post-op Pain: mild  Post-op Assessment: Patient's Cardiovascular Status Stable and Respiratory Function Stable              Post-op Vital Signs: stable  Last Vitals:  Filed Vitals:   11/16/14 0810  BP: 87/38  Pulse: 59  Temp: 37.1 C  Resp: 20    Complications: No apparent anesthesia complications

## 2014-11-16 NOTE — Addendum Note (Signed)
Addendum  created 11/16/14 0954 by Renford DillsJanet L Deonte Otting, CRNA   Modules edited: Notes Section   Notes Section:  File: [id number]

## 2014-11-17 NOTE — Progress Notes (Signed)
Subjective: Postpartum Day 2: Cesarean Delivery Patient reports incisional pain, tolerating PO and no problems voiding.    Objective: Vital signs in last 24 hours: Temp:  [97.8 F (36.6 C)-98.4 F (36.9 C)] 97.8 F (36.6 C) (10/15 0510) Pulse Rate:  [61-67] 67 (10/15 0510) Resp:  [16-18] 18 (10/15 0510) BP: (101-104)/(48-64) 102/64 mmHg (10/15 0510) SpO2:  [97 %-98 %] 98 % (10/14 1705)  Physical Exam:  General: alert and cooperative Lochia: appropriate Uterine Fundus: firm Incision: C/D/I    Recent Labs  11/15/14 1630 11/16/14 0535  HGB 13.4 11.1*  HCT 39.0 32.9*    Assessment/Plan: Status post Cesarean section. Doing well postoperatively.  Continue current care. Baby went to NICU for elevated bilirubin levels, but those are improving on triple phototherapy and IV hydration  Thao Vanover W 11/17/2014, 8:34 AM

## 2014-11-17 NOTE — Lactation Note (Signed)
This note was copied from the chart of Wendy Prescilla Soursmanda Obriant. Lactation Consultation Note  Patient Name: Wendy Prescilla Soursmanda Sarwar RUEAV'WToday's Date: 11/17/2014 Reason for consult: Follow-up assessment;NICU baby;Hyperbilirubinemia  Baby in NICU on Double Photo. Per mom has pumped with only drops as yield.  Mom expressed she was very tired. LC recommended getting a good nap and rest,  Then visit the baby , ask if skin to skin is a possibility, with photo tx blanket underneath  Baby and on top. Skin to skin will help moms milk supply and let down .     Maternal Data    Feeding Feeding Type: Formula Nipple Type: Slow - flow Length of feed: 30 min  LATCH Score/Interventions                Intervention(s): Breastfeeding basics reviewed     Lactation Tools Discussed/Used Tools: Pump Breast pump type: Double-Electric Breast Pump Pump Review:  (pump already set up )   Consult Status Consult Status: Follow-up Date: 11/25/14 Follow-up type: In-patient    Kathrin Greathouseorio, Arlis Yale Ann 11/17/2014, 4:29 PM

## 2014-11-18 NOTE — Progress Notes (Signed)
Subjective: Postpartum Day 3 Cesarean Delivery Patient reports tolerating PO and no problems voiding.    Objective: Vital signs in last 24 hours: Temp:  [98.3 F (36.8 C)] 98.3 F (36.8 C) (10/15 1800) Pulse Rate:  [62] 62 (10/15 1800) Resp:  [20] 20 (10/15 1800) BP: (105)/(66) 105/66 mmHg (10/15 1800) SpO2:  [98 %] 98 % (10/15 1800)  Physical Exam:  General: alert and cooperative Lochia: appropriate Uterine Fundus: firm Incision: C/D/I    Recent Labs  11/15/14 1630 11/16/14 0535  HGB 13.4 11.1*  HCT 39.0 32.9*    Assessment/Plan: Status post Cesarean section. Doing well postoperatively.  Continue current care, will stay until tomorrow since baby rapidly improving in NICU from hyperbilirubinemia, may be ready to go home in 1-2 more days. She does eventually want circumcision when baby ready  Lashai Grosch W 11/18/2014, 10:55 AM

## 2014-11-18 NOTE — Lactation Note (Signed)
This note was copied from the chart of Wendy Prescilla Soursmanda Kelch. Lactation Consultation Note  Patient Name: Wendy Harding WUJWJ'XToday's Date: 11/18/2014 Reason for consult: Follow-up assessment;NICU baby;Other (Comment) (per mom milk is in )  Per mom will be D/C tomorrow. Milk is in pumping off 20 ml this am , and ice when needing it. LC reviewed sore nipple and engorgement prevention and tx.  LC also encouraged mom rest and nap to enhance let down. Mom using the DEBP .  Per mom [facility] Healthy [facility] program today or tomorrow.    Maternal Data    Feeding Feeding Type: Formula Nipple Type: Slow - flow Length of feed: 30 min  LATCH Score/Interventions                Intervention(s): Breastfeeding basics reviewed     Lactation Tools Discussed/Used Tools: Pump Breast pump type: Double-Electric Breast Pump WIC Program: No   Consult Status Consult Status: Follow-up Date: 11/19/14 Follow-up type: In-patient    Kathrin Greathouseorio, Prem Coykendall Ann 11/18/2014, 11:46 AM

## 2014-11-18 NOTE — Discharge Summary (Signed)
Obstetric Discharge Summary Reason for Admission: cesarean section for category 2 Tracing remote from delivery Prenatal Procedures: NST Intrapartum Procedures: cesarean: low cervical, transverse Postpartum Procedures: none Complications-Operative and Postpartum: none HEMOGLOBIN  Date Value Ref Range Status  11/16/2014 11.1* 12.0 - 15.0 g/dL Final   HCT  Date Value Ref Range Status  11/16/2014 32.9* 36.0 - 46.0 % Final    Physical Exam:  General: alert and cooperative Lochia: appropriate Uterine Fundus: firm Incision: C/D/I   Discharge Diagnoses: Term Pregnancy-delivered                                          Category 2 tracing                                          Thick meconium stained fluid  Discharge Information: Date: 11/19/2014 Activity: pelvic rest Diet: routine Medications: Ibuprofen and Percocet Condition: improved Instructions: refer to practice specific booklet Discharge to: home Follow-up Information    Follow up with MEISINGER,TODD D, MD In 2 weeks.   Specialty:  Obstetrics and Gynecology   Why:  incision check   Contact information:   [address], [address] [place] [phone]       Newborn Data: Live born female  Birth Weight: 6 lb 12.6 oz (3080 g) APGAR: 8, 9  Stable in NICU  Wendy Harding 11/19/2014, 8:29 AM

## 2014-11-19 MED ORDER — OXYCODONE-ACETAMINOPHEN 5-325 MG PO TABS: 1.0000 | ORAL_TABLET | ORAL | Status: DC | PRN

## 2014-11-19 MED ORDER — IBUPROFEN 600 MG PO TABS: 600.0000 mg | ORAL_TABLET | Freq: Four times a day (QID) | ORAL | Status: DC

## 2014-11-19 NOTE — Lactation Note (Signed)
This note was copied from the chart of Wendy Prescilla Soursmanda Kruger. Lactation Consultation Note  Patient Name: Wendy Harding NWGNF'AToday's Date: 11/19/2014 Reason for consult: Follow-up assessment;NICU baby NICU baby 5090 hours old. Mom using DEBP when this LC entered the room, but mom only pumping one breast. Mom states that she is needing to massage breast while pumping. Enc mom to convert a sports bra for hands-free pumping in order to massage both breast simultaneously while pumping. Discussed rationale for pumping both breast at same time. Mom reports that baby is being D/C'd with mom today. Discussed ways of knowing that baby getting enough at the breast and transitioning from pumping and supplementing to exclusive breastfeeding. Enc mom to put baby to breast first at each feeding, supplement with EBM/Formula, and then post-pump. Mom aware of OP/BFSG and LC phone line assistance after D/C. Mom has personal pump.  Maternal Data    Feeding Feeding Type: Breast Milk with Formula added Nipple Type: Slow - flow Length of feed: 30 min  LATCH Score/Interventions                      Lactation Tools Discussed/Used     Consult Status Consult Status: Complete    Cher Egnor 11/19/2014, 12:30 PM

## 2014-11-19 NOTE — Progress Notes (Signed)
Subjective: Postpartum Day 4: Cesarean Delivery Patient reports tolerating PO, + flatus, + BM and no problems voiding.    Objective: Vital signs in last 24 hours: Temp:  [98.1 F (36.7 C)-98.3 F (36.8 C)] 98.3 F (36.8 C) (10/17 0528) Pulse Rate:  [71-75] 75 (10/17 0528) Resp:  [18] 18 (10/17 0528) BP: (119-127)/(72-78) 127/72 mmHg (10/17 0528)  Physical Exam:  General: alert and cooperative Lochia: appropriate Uterine Fundus: firm Incision: C/D/I   No results for input(s): HGB, HCT in the last 72 hours.  Assessment/Plan: Status post Cesarean section. Doing well postoperatively.  Discharge home with standard precautions and return to clinic in 2 weeks. May room in with baby this PM  Nahiara Kretzschmar W 11/19/2014, 8:27 AM

## 2014-11-19 NOTE — Progress Notes (Signed)
CSW acknowledges NICU admission.    Patient screened out for psychosocial assessment since none of the following apply:  Psychosocial stressors documented in mother or baby's chart  Gestation less than 32 weeks  Code at delivery   Critically ill infant  Infant with anomalies  Please contact the Clinical Social Worker if specific needs arise, or by MOB's request.       

## 2015-03-18 DIAGNOSIS — Z1151 Encounter for screening for human papillomavirus (HPV): Secondary | ICD-10-CM | POA: Diagnosis not present

## 2015-03-18 DIAGNOSIS — Z30431 Encounter for routine checking of intrauterine contraceptive device: Secondary | ICD-10-CM | POA: Diagnosis not present

## 2015-03-18 DIAGNOSIS — Z124 Encounter for screening for malignant neoplasm of cervix: Secondary | ICD-10-CM | POA: Diagnosis not present

## 2015-03-18 DIAGNOSIS — N898 Other specified noninflammatory disorders of vagina: Secondary | ICD-10-CM | POA: Diagnosis not present

## 2015-03-18 MED FILL — TERCONAZOLE 0.4% VAG CREAM: 0.4 | 7 days supply | Qty: 45 | Fill #0

## 2015-03-19 DIAGNOSIS — Z124 Encounter for screening for malignant neoplasm of cervix: Secondary | ICD-10-CM | POA: Diagnosis not present

## 2015-07-26 MED FILL — metroNIDAZOLE 500 MG TABS: 500 | 14 days supply | Qty: 14 | Fill #0

## 2015-08-28 DIAGNOSIS — R102 Pelvic and perineal pain: Secondary | ICD-10-CM | POA: Diagnosis not present

## 2015-08-28 DIAGNOSIS — N898 Other specified noninflammatory disorders of vagina: Secondary | ICD-10-CM | POA: Diagnosis not present

## 2015-08-29 MED FILL — TRIAMCINOLONE 0.1% OINTMENT: 0.1 | 7 days supply | Qty: 15 | Fill #0

## 2015-12-09 DIAGNOSIS — H5213 Myopia, bilateral: Secondary | ICD-10-CM | POA: Diagnosis not present

## 2016-01-16 ENCOUNTER — Other Ambulatory Visit: Payer: Self-pay | Admitting: Occupational Medicine

## 2016-01-16 ENCOUNTER — Ambulatory Visit: Payer: Self-pay

## 2016-01-16 DIAGNOSIS — M79671 Pain in right foot: Secondary | ICD-10-CM

## 2016-02-12 MED FILL — CEPHALEXIN 500 MG CAPSULE: 500 | 5 days supply | Qty: 15 | Fill #0

## 2016-05-20 DIAGNOSIS — M545 Low back pain: Secondary | ICD-10-CM | POA: Diagnosis not present

## 2016-05-20 DIAGNOSIS — N898 Other specified noninflammatory disorders of vagina: Secondary | ICD-10-CM | POA: Diagnosis not present

## 2016-05-21 MED FILL — FLUCONAZOLE 150 MG TABLET: 150 | 3 days supply | Qty: 2 | Fill #0

## 2016-09-16 DIAGNOSIS — N898 Other specified noninflammatory disorders of vagina: Secondary | ICD-10-CM | POA: Diagnosis not present

## 2016-09-16 DIAGNOSIS — Z130 Encounter for screening for diseases of the blood and blood-forming organs and certain disorders involving the immune mechanism: Secondary | ICD-10-CM | POA: Diagnosis not present

## 2016-09-16 DIAGNOSIS — Z0000 Encounter for general adult medical examination without abnormal findings: Secondary | ICD-10-CM | POA: Diagnosis not present

## 2016-09-16 DIAGNOSIS — Z124 Encounter for screening for malignant neoplasm of cervix: Secondary | ICD-10-CM | POA: Diagnosis not present

## 2016-09-16 DIAGNOSIS — Z1151 Encounter for screening for human papillomavirus (HPV): Secondary | ICD-10-CM | POA: Diagnosis not present

## 2016-09-16 DIAGNOSIS — Z01419 Encounter for gynecological examination (general) (routine) without abnormal findings: Secondary | ICD-10-CM | POA: Diagnosis not present

## 2016-09-16 DIAGNOSIS — B373 Candidiasis of vulva and vagina: Secondary | ICD-10-CM | POA: Diagnosis not present

## 2016-09-16 DIAGNOSIS — Z30432 Encounter for removal of intrauterine contraceptive device: Secondary | ICD-10-CM | POA: Diagnosis not present

## 2016-09-16 DIAGNOSIS — Z1389 Encounter for screening for other disorder: Secondary | ICD-10-CM | POA: Diagnosis not present

## 2016-09-16 MED FILL — FLUCONAZOLE 150 MG TABLET: 150 | 3 days supply | Qty: 2 | Fill #0

## 2016-11-25 DIAGNOSIS — N911 Secondary amenorrhea: Secondary | ICD-10-CM | POA: Diagnosis not present

## 2016-11-25 DIAGNOSIS — Z3201 Encounter for pregnancy test, result positive: Secondary | ICD-10-CM | POA: Diagnosis not present

## 2016-12-23 DIAGNOSIS — O34211 Maternal care for low transverse scar from previous cesarean delivery: Secondary | ICD-10-CM | POA: Diagnosis not present

## 2016-12-23 DIAGNOSIS — Z113 Encounter for screening for infections with a predominantly sexual mode of transmission: Secondary | ICD-10-CM | POA: Diagnosis not present

## 2016-12-23 DIAGNOSIS — O09521 Supervision of elderly multigravida, first trimester: Secondary | ICD-10-CM | POA: Diagnosis not present

## 2016-12-23 DIAGNOSIS — Z3689 Encounter for other specified antenatal screening: Secondary | ICD-10-CM | POA: Diagnosis not present

## 2016-12-23 DIAGNOSIS — Z3A10 10 weeks gestation of pregnancy: Secondary | ICD-10-CM | POA: Diagnosis not present

## 2016-12-23 DIAGNOSIS — B373 Candidiasis of vulva and vagina: Secondary | ICD-10-CM | POA: Diagnosis not present

## 2016-12-23 DIAGNOSIS — O26891 Other specified pregnancy related conditions, first trimester: Secondary | ICD-10-CM | POA: Diagnosis not present

## 2016-12-23 LAB — OB RESULTS CONSOLE ABO/RH: RH Type: POSITIVE

## 2016-12-23 LAB — OB RESULTS CONSOLE RUBELLA ANTIBODY, IGM: Rubella: IMMUNE

## 2016-12-23 LAB — OB RESULTS CONSOLE GC/CHLAMYDIA
CHLAMYDIA, DNA PROBE: NEGATIVE
GC PROBE AMP, GENITAL: NEGATIVE

## 2016-12-23 LAB — OB RESULTS CONSOLE ANTIBODY SCREEN: Antibody Screen: NEGATIVE

## 2016-12-23 LAB — OB RESULTS CONSOLE RPR: RPR: NONREACTIVE

## 2016-12-23 LAB — OB RESULTS CONSOLE HEPATITIS B SURFACE ANTIGEN: Hepatitis B Surface Ag: NEGATIVE

## 2016-12-23 LAB — OB RESULTS CONSOLE HIV ANTIBODY (ROUTINE TESTING): HIV: NONREACTIVE

## 2016-12-23 MED FILL — TERCONAZOLE 0.8% VAGINAL CR: 0.8 | 3 days supply | Qty: 20 | Fill #0

## 2017-02-03 MED FILL — TERCONAZOLE 0.8% VAGINAL CR: 0.8 | 3 days supply | Qty: 20 | Fill #0

## 2017-02-25 DIAGNOSIS — Z363 Encounter for antenatal screening for malformations: Secondary | ICD-10-CM | POA: Diagnosis not present

## 2017-02-25 DIAGNOSIS — O34211 Maternal care for low transverse scar from previous cesarean delivery: Secondary | ICD-10-CM | POA: Diagnosis not present

## 2017-02-25 DIAGNOSIS — O09519 Supervision of elderly primigravida, unspecified trimester: Secondary | ICD-10-CM | POA: Diagnosis not present

## 2017-02-25 DIAGNOSIS — Z3A19 19 weeks gestation of pregnancy: Secondary | ICD-10-CM | POA: Diagnosis not present

## 2017-03-29 DIAGNOSIS — H5213 Myopia, bilateral: Secondary | ICD-10-CM | POA: Diagnosis not present

## 2017-04-09 DIAGNOSIS — O3680X0 Pregnancy with inconclusive fetal viability, not applicable or unspecified: Secondary | ICD-10-CM | POA: Diagnosis not present

## 2017-04-29 DIAGNOSIS — Z23 Encounter for immunization: Secondary | ICD-10-CM | POA: Diagnosis not present

## 2017-04-29 DIAGNOSIS — O09519 Supervision of elderly primigravida, unspecified trimester: Secondary | ICD-10-CM | POA: Diagnosis not present

## 2017-04-29 DIAGNOSIS — O34211 Maternal care for low transverse scar from previous cesarean delivery: Secondary | ICD-10-CM | POA: Diagnosis not present

## 2017-04-29 DIAGNOSIS — O4403 Placenta previa specified as without hemorrhage, third trimester: Secondary | ICD-10-CM | POA: Diagnosis not present

## 2017-04-29 DIAGNOSIS — Z3689 Encounter for other specified antenatal screening: Secondary | ICD-10-CM | POA: Diagnosis not present

## 2017-04-29 DIAGNOSIS — O4400 Placenta previa specified as without hemorrhage, unspecified trimester: Secondary | ICD-10-CM | POA: Diagnosis not present

## 2017-04-29 DIAGNOSIS — O3429 Maternal care due to uterine scar from other previous surgery: Secondary | ICD-10-CM | POA: Diagnosis not present

## 2017-04-29 DIAGNOSIS — O09523 Supervision of elderly multigravida, third trimester: Secondary | ICD-10-CM | POA: Diagnosis not present

## 2017-04-29 DIAGNOSIS — Z3A28 28 weeks gestation of pregnancy: Secondary | ICD-10-CM | POA: Diagnosis not present

## 2017-05-05 DIAGNOSIS — O99810 Abnormal glucose complicating pregnancy: Secondary | ICD-10-CM | POA: Diagnosis not present

## 2017-05-27 DIAGNOSIS — O09513 Supervision of elderly primigravida, third trimester: Secondary | ICD-10-CM | POA: Diagnosis not present

## 2017-05-27 DIAGNOSIS — O4413 Placenta previa with hemorrhage, third trimester: Secondary | ICD-10-CM | POA: Diagnosis not present

## 2017-05-27 DIAGNOSIS — O34211 Maternal care for low transverse scar from previous cesarean delivery: Secondary | ICD-10-CM | POA: Diagnosis not present

## 2017-05-27 DIAGNOSIS — Z3A32 32 weeks gestation of pregnancy: Secondary | ICD-10-CM | POA: Diagnosis not present

## 2017-06-08 ENCOUNTER — Inpatient Hospital Stay (HOSPITAL_COMMUNITY)
Admission: AD | Admit: 2017-06-08 | Discharge: 2017-06-08 | Disposition: A | Discharge status: Home / Self Care | Payer: 59 | Source: Ambulatory Visit | Attending: Obstetrics and Gynecology | Admitting: Obstetrics and Gynecology

## 2017-06-08 ENCOUNTER — Encounter (HOSPITAL_COMMUNITY): Payer: Self-pay

## 2017-06-08 ENCOUNTER — Encounter (HOSPITAL_COMMUNITY): Payer: Self-pay | Admitting: *Deleted

## 2017-06-08 DIAGNOSIS — Z87891 Personal history of nicotine dependence: Secondary | ICD-10-CM | POA: Diagnosis not present

## 2017-06-08 DIAGNOSIS — O26893 Other specified pregnancy related conditions, third trimester: Secondary | ICD-10-CM | POA: Diagnosis not present

## 2017-06-08 DIAGNOSIS — Z3A33 33 weeks gestation of pregnancy: Secondary | ICD-10-CM | POA: Insufficient documentation

## 2017-06-08 DIAGNOSIS — R109 Unspecified abdominal pain: Secondary | ICD-10-CM | POA: Insufficient documentation

## 2017-06-08 DIAGNOSIS — Z79899 Other long term (current) drug therapy: Secondary | ICD-10-CM | POA: Diagnosis not present

## 2017-06-08 LAB — URINALYSIS, ROUTINE W REFLEX MICROSCOPIC
Bilirubin Urine: NEGATIVE
Glucose, UA: NEGATIVE mg/dL
Hgb urine dipstick: NEGATIVE
Ketones, ur: 20 mg/dL — AB
Leukocytes, UA: NEGATIVE
NITRITE: NEGATIVE
Protein, ur: NEGATIVE mg/dL
SPECIFIC GRAVITY, URINE: 1.009 (ref 1.005–1.030)
pH: 6 (ref 5.0–8.0)

## 2017-06-08 NOTE — Discharge Instructions (Signed)

## 2017-06-08 NOTE — MAU Note (Addendum)
Pt presents to MAU with complaints of pain in her right lower abdomen around 1145 today. 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 Denies any VB or LOF. States baby is active

## 2017-06-08 NOTE — MAU Provider Note (Signed)
History     CSN: [id number]  Arrival date and time: 06/08/17 1217   First Provider Initiated Contact with Patient 06/08/17 1317      Chief Complaint  Patient presents with  . Abdominal Pain   HPI Wendy Harding is a 37 y.o. G2P1001 at [redacted]w[redacted]d who presents with abdominal pain. Reports sporadic episodes of RLQ pain over the weekend and then again this morning. Pain is intermittent; first she said pain lasted for 8 minutes but then said that the pain occurred every 8 minutes as reported to her by someone else. Last time she felt the pain was at 1145 this morning. States when pain is happening that it is worse with movement. Denies fever/chills, n/v/d, dysuria, LOF, or vaginal bleeding. Positive fetal movement.   OB History    Gravida  2   Para  1   Term  1   Preterm      AB      Living  1     SAB      TAB      Ectopic      Multiple  0   Live Births  1           Past Medical History:  Diagnosis Date  . Infection    UTI  . Vaginal Pap smear, abnormal    colpo, ok since    Past Surgical History:  Procedure Laterality Date  . CESAREAN SECTION N/A 11/15/2014   Procedure: CESAREAN SECTION;  Surgeon: Lavina Hamman, MD;  Location: [facility];  Service: Obstetrics;  Laterality: N/A;  . COLPOSCOPY      Family History  Problem Relation Age of Onset  . Asthma Mother   . Heart disease Maternal Grandmother   . Heart disease Maternal Grandfather     Social History   Tobacco Use  . Smoking status: Former Smoker    Types: Cigarettes  . Smokeless tobacco: Never Used  . Tobacco comment: 2014  Substance Use Topics  . Alcohol use: Yes    Comment: none with preg  . Drug use: No    Allergies: No Known Allergies  Medications Prior to Admission  Medication Sig Dispense Refill Last Dose  . calcium carbonate (TUMS - DOSED IN MG ELEMENTAL CALCIUM) 500 MG chewable tablet Chew 1-2 tablets by mouth daily as needed for indigestion or heartburn.   Past Week at Unknown time   . ibuprofen (ADVIL,MOTRIN) 600 MG tablet Take 1 tablet (600 mg total) by mouth every 6 (six) hours. 30 tablet 0   . oxyCODONE-acetaminophen (PERCOCET/ROXICET) 5-325 MG tablet Take 1-2 tablets by mouth every 4 (four) hours as needed for severe pain. 30 tablet 0   . Prenatal Vit-Fe Fumarate-FA (PRENATAL MULTIVITAMIN) TABS tablet Take 1 tablet by mouth daily at 12 noon.   11/14/2014 at Unknown time    Review of Systems  Constitutional: Negative.   Gastrointestinal: Positive for abdominal pain (none currently). Negative for diarrhea, nausea and vomiting.  Genitourinary: Negative.    Physical Exam   Blood pressure 120/83, pulse 71, temperature 98.4 F (36.9 C), resp. rate 18, height  (1.6 m), weight 181 lb (82.1 kg), last menstrual period 10/14/2016, unknown if currently breastfeeding.  Physical Exam  Nursing note and vitals reviewed. Constitutional: She is oriented to person, place, and time. She appears well-developed and well-nourished. No distress.  HENT:  Head: Normocephalic and atraumatic.  Eyes: Conjunctivae are normal. Right eye exhibits no discharge. Left eye exhibits no discharge. No scleral icterus.  Neck: Normal range of motion.  Cardiovascular: Normal rate, regular rhythm and normal heart sounds.  No murmur heard. Respiratory: Effort normal and breath sounds normal. No respiratory distress. She has no wheezes.  GI: Soft. Bowel sounds are normal. There is no tenderness. There is no rigidity, no rebound, no guarding and no tenderness at McBurney's point.  Genitourinary:  Genitourinary Comments: Dilation: Closed Effacement (%): Thick Cervical Position: Posterior Station: -3 Exam by:: Judeth Horn NP   Neurological: She is alert and oriented to person, place, and time.  Skin: Skin is warm and dry. She is not diaphoretic.  Psychiatric: She has a normal mood and affect. Her behavior is normal. Judgment and thought content normal.    MAU Course  Procedures Results for  orders placed or performed during the hospital encounter of 06/08/17 (from the past 24 hour(s))  Urinalysis, Routine w reflex microscopic     Status: Abnormal   Collection Time: 06/08/17 12:45 PM  Result Value Ref Range   Color, Urine YELLOW YELLOW   APPearance CLEAR CLEAR   Specific Gravity, Urine 1.009 1.005 - 1.030   pH 6.0 5.0 - 8.0   Glucose, UA NEGATIVE NEGATIVE mg/dL   Hgb urine dipstick NEGATIVE NEGATIVE   Bilirubin Urine NEGATIVE NEGATIVE   Ketones, ur 20 (A) NEGATIVE mg/dL   Protein, ur NEGATIVE NEGATIVE mg/dL   Nitrite NEGATIVE NEGATIVE   Leukocytes, UA NEGATIVE NEGATIVE    MDM NST:  Baseline: 125 bpm, Variability: Good {> 6 bpm), Accelerations: Reactive, Decelerations: Absent and no contractions Reactive NST. No ctx on monitor. Cervix closed/thick/posterior VSS, pt afebrile. Benign abdominal exam.   Dr. Mindi Slicker notified of patient's presentation, VS, NST, & cervical exam with plan for discharge. Pt has next appt in office on Thursday. Ok with plan.   Assessment and Plan  A:  1. Abdominal pain during pregnancy in third trimester   2. [redacted] weeks gestation of pregnancy    P: Discharge home Discussed reasons to return to MAU Keep scheduled OB appt on Thursday   Judeth Horn 06/08/2017, 1:17 PM

## 2017-06-10 DIAGNOSIS — O4103X0 Oligohydramnios, third trimester, not applicable or unspecified: Secondary | ICD-10-CM | POA: Diagnosis not present

## 2017-06-10 DIAGNOSIS — Z3A34 34 weeks gestation of pregnancy: Secondary | ICD-10-CM | POA: Diagnosis not present

## 2017-06-10 DIAGNOSIS — O34211 Maternal care for low transverse scar from previous cesarean delivery: Secondary | ICD-10-CM | POA: Diagnosis not present

## 2017-06-10 DIAGNOSIS — O4103X1 Oligohydramnios, third trimester, fetus 1: Secondary | ICD-10-CM | POA: Diagnosis not present

## 2017-06-10 DIAGNOSIS — O09513 Supervision of elderly primigravida, third trimester: Secondary | ICD-10-CM | POA: Diagnosis not present

## 2017-06-14 DIAGNOSIS — Z3A34 34 weeks gestation of pregnancy: Secondary | ICD-10-CM | POA: Diagnosis not present

## 2017-06-14 DIAGNOSIS — O4103X0 Oligohydramnios, third trimester, not applicable or unspecified: Secondary | ICD-10-CM | POA: Diagnosis not present

## 2017-06-14 DIAGNOSIS — O4103X1 Oligohydramnios, third trimester, fetus 1: Secondary | ICD-10-CM | POA: Diagnosis not present

## 2017-06-16 DIAGNOSIS — O4403 Placenta previa specified as without hemorrhage, third trimester: Secondary | ICD-10-CM | POA: Diagnosis not present

## 2017-06-16 DIAGNOSIS — O4103X1 Oligohydramnios, third trimester, fetus 1: Secondary | ICD-10-CM | POA: Diagnosis not present

## 2017-06-16 DIAGNOSIS — O4103X0 Oligohydramnios, third trimester, not applicable or unspecified: Secondary | ICD-10-CM | POA: Diagnosis not present

## 2017-06-16 DIAGNOSIS — Z3A35 35 weeks gestation of pregnancy: Secondary | ICD-10-CM | POA: Diagnosis not present

## 2017-06-16 DIAGNOSIS — O34211 Maternal care for low transverse scar from previous cesarean delivery: Secondary | ICD-10-CM | POA: Diagnosis not present

## 2017-06-16 DIAGNOSIS — O09513 Supervision of elderly primigravida, third trimester: Secondary | ICD-10-CM | POA: Diagnosis not present

## 2017-06-16 DIAGNOSIS — Z3685 Encounter for antenatal screening for Streptococcus B: Secondary | ICD-10-CM | POA: Diagnosis not present

## 2017-06-16 LAB — OB RESULTS CONSOLE GBS: STREP GROUP B AG: NEGATIVE

## 2017-06-21 DIAGNOSIS — Z3A35 35 weeks gestation of pregnancy: Secondary | ICD-10-CM | POA: Diagnosis not present

## 2017-06-21 DIAGNOSIS — O4103X0 Oligohydramnios, third trimester, not applicable or unspecified: Secondary | ICD-10-CM | POA: Diagnosis not present

## 2017-06-21 DIAGNOSIS — O4103X1 Oligohydramnios, third trimester, fetus 1: Secondary | ICD-10-CM | POA: Diagnosis not present

## 2017-06-25 DIAGNOSIS — O09513 Supervision of elderly primigravida, third trimester: Secondary | ICD-10-CM | POA: Diagnosis not present

## 2017-06-25 DIAGNOSIS — O4103X0 Oligohydramnios, third trimester, not applicable or unspecified: Secondary | ICD-10-CM | POA: Diagnosis not present

## 2017-06-25 DIAGNOSIS — Z3A36 36 weeks gestation of pregnancy: Secondary | ICD-10-CM | POA: Diagnosis not present

## 2017-06-25 DIAGNOSIS — O34211 Maternal care for low transverse scar from previous cesarean delivery: Secondary | ICD-10-CM | POA: Diagnosis not present

## 2017-06-29 DIAGNOSIS — O4103X0 Oligohydramnios, third trimester, not applicable or unspecified: Secondary | ICD-10-CM | POA: Diagnosis not present

## 2017-06-29 DIAGNOSIS — O4103X1 Oligohydramnios, third trimester, fetus 1: Secondary | ICD-10-CM | POA: Diagnosis not present

## 2017-06-29 DIAGNOSIS — Z3A36 36 weeks gestation of pregnancy: Secondary | ICD-10-CM | POA: Diagnosis not present

## 2017-07-01 ENCOUNTER — Encounter (HOSPITAL_COMMUNITY): Payer: Self-pay

## 2017-07-01 ENCOUNTER — Telehealth (HOSPITAL_COMMUNITY): Payer: Self-pay | Admitting: *Deleted

## 2017-07-01 NOTE — Telephone Encounter (Signed)
Preadmission screen  

## 2017-07-02 DIAGNOSIS — O4103X1 Oligohydramnios, third trimester, fetus 1: Secondary | ICD-10-CM | POA: Diagnosis not present

## 2017-07-02 DIAGNOSIS — O4103X0 Oligohydramnios, third trimester, not applicable or unspecified: Secondary | ICD-10-CM | POA: Diagnosis not present

## 2017-07-02 DIAGNOSIS — Z3A37 37 weeks gestation of pregnancy: Secondary | ICD-10-CM | POA: Diagnosis not present

## 2017-07-05 ENCOUNTER — Telehealth (HOSPITAL_COMMUNITY): Payer: Self-pay | Admitting: *Deleted

## 2017-07-05 ENCOUNTER — Encounter (HOSPITAL_COMMUNITY): Payer: Self-pay

## 2017-07-05 DIAGNOSIS — O4103X1 Oligohydramnios, third trimester, fetus 1: Secondary | ICD-10-CM | POA: Diagnosis not present

## 2017-07-05 DIAGNOSIS — Z3A37 37 weeks gestation of pregnancy: Secondary | ICD-10-CM | POA: Diagnosis not present

## 2017-07-05 DIAGNOSIS — O4103X0 Oligohydramnios, third trimester, not applicable or unspecified: Secondary | ICD-10-CM | POA: Diagnosis not present

## 2017-07-05 NOTE — Telephone Encounter (Signed)
Preadmission screen  

## 2017-07-08 ENCOUNTER — Encounter (HOSPITAL_COMMUNITY): Payer: Self-pay | Admitting: Anesthesiology

## 2017-07-08 ENCOUNTER — Encounter (HOSPITAL_COMMUNITY)
Admission: RE | Admit: 2017-07-08 | Discharge: 2017-07-08 | Disposition: A | Discharge status: Home / Self Care | Payer: 59 | Source: Ambulatory Visit | Attending: Obstetrics and Gynecology | Admitting: Obstetrics and Gynecology

## 2017-07-08 DIAGNOSIS — O4103X0 Oligohydramnios, third trimester, not applicable or unspecified: Secondary | ICD-10-CM | POA: Diagnosis not present

## 2017-07-08 DIAGNOSIS — Z3A38 38 weeks gestation of pregnancy: Secondary | ICD-10-CM | POA: Diagnosis not present

## 2017-07-08 DIAGNOSIS — O34211 Maternal care for low transverse scar from previous cesarean delivery: Secondary | ICD-10-CM | POA: Diagnosis not present

## 2017-07-08 DIAGNOSIS — O09519 Supervision of elderly primigravida, unspecified trimester: Secondary | ICD-10-CM | POA: Diagnosis not present

## 2017-07-08 DIAGNOSIS — O4103X1 Oligohydramnios, third trimester, fetus 1: Secondary | ICD-10-CM | POA: Diagnosis not present

## 2017-07-08 DIAGNOSIS — Z87891 Personal history of nicotine dependence: Secondary | ICD-10-CM | POA: Diagnosis not present

## 2017-07-08 LAB — CBC
HEMATOCRIT: 40.1 % (ref 36.0–46.0)
Hemoglobin: 13.7 g/dL (ref 12.0–15.0)
MCH: 29.3 pg (ref 26.0–34.0)
MCHC: 34.2 g/dL (ref 30.0–36.0)
MCV: 85.9 fL (ref 78.0–100.0)
Platelets: 266 10*3/uL (ref 150–400)
RBC: 4.67 MIL/uL (ref 3.87–5.11)
RDW: 14 % (ref 11.5–15.5)
WBC: 9 10*3/uL (ref 4.0–10.5)

## 2017-07-08 LAB — TYPE AND SCREEN
ABO/RH(D): O POS
ANTIBODY SCREEN: NEGATIVE

## 2017-07-08 NOTE — Patient Instructions (Signed)
Tora Kindredmanda C Mejorado  07/08/2017   Your procedure is scheduled on:  07/09/2017  [facility] through [facility]tr[facility] at 0615 AM.  Pick up the phone at the desk and dial [phone]  Call this number if you have problems the morning of surgery:[phone]  Remember:   Do not eat food:(After Midnight) Desps de medianoche.  Do not drink clear liquids: (After Midnight) Desps de medianoche.  Take these medicines the morning of surgery with A SIP OF WATER: may take zantac   Do not wear jewelry, make-up or nail polish.  Do not wear lotions, powders, or perfumes. Do not wear deodorant.  Do not shave 48 hours prior to surgery.  Do not bring valuables to the hospital.  [facility] is not   responsible for any belongings or valuables brought to the hospital.  Contacts, dentures or bridgework may not be worn into surgery.  Leave suitcase in the car. After surgery it may be brought to your room.  For patients admitted to the hospital, checkout time is 11:00 AM the day of              discharge.    N/A   Please read over the following fact sheets that you were given:   Surgical Site Infection Prevention

## 2017-07-08 NOTE — H&P (Signed)
Wendy Harding is a 37 y.o. female at 7 2/7 weeks (EDD 07/21/17 by LMP c/w 9 week [place])  presenting for repeat c-section given prior c-section and no desire for TOL in the setting of severe oligohydramnios.  Pt has been followed for low normal AFii levels aroun 7-8 for several weeks, but yesterday AFI noted to be <5 with no greater than 2cm pocket noted.  She was originally scheduled for repeat c-section at 39 weeks, but given this medical indication it was moved up Prenatal care otherwise significant for AMA, declined screenings.  She had her prior c-section for category 2 tracing and was given option of VBAC but declines.  She had a low lying placenta that resolved.  OB History    Gravida  2   Para  1   Term  1   Preterm      AB      Living  1     SAB      TAB      Ectopic      Multiple  0   Live Births  1         2016 LTCS at 41 weeks 6#12 oz  Past Medical History:  Diagnosis Date  . AMA (advanced maternal age) multigravida 35+   . Infection    UTI  . Vaginal Pap smear, abnormal    colpo, ok since   Past Surgical History:  Procedure Laterality Date  . CESAREAN SECTION N/A 11/15/2014   Procedure: CESAREAN SECTION;  Surgeon: Lavina Hamman, MD;  Location: [facility];  Service: Obstetrics;  Laterality: N/A;  . COLPOSCOPY    . WISDOM TOOTH EXTRACTION     Family History: family history includes Asthma in her mother; Heart disease in her maternal grandfather and maternal grandmother; Heart failure in her maternal grandmother; Sleep apnea in her mother. Social History:  reports that she has quit smoking. Her smoking use included cigarettes. She has never used smokeless tobacco. She reports that she drinks alcohol. She reports that she does not use drugs.     Maternal Diabetes: No Genetic Screening: Declined Maternal Ultrasounds/Referrals: Normal Fetal Ultrasounds or other Referrals:  None Maternal Substance Abuse:  No Significant Maternal Medications:  None Significant  Maternal Lab Results:  None Other Comments:  None  Review of Systems  Gastrointestinal: Negative for abdominal pain.  Neurological: Negative for headaches.   Maternal Medical History:  Contractions: Frequency: rare.   Perceived severity is mild.    Fetal activity: Perceived fetal activity is normal.   Last perceived fetal movement was within the past hour.    Prenatal complications: Oligohydramnios (AFI 2.6 on 07/08/17).   Prenatal Complications - Diabetes: none.      Last menstrual period 10/14/2016, unknown if currently breastfeeding. Maternal Exam:  Uterine Assessment: Contraction strength is mild.  Contraction frequency is rare.   Abdomen: Patient reports no abdominal tenderness. Surgical scars: low transverse.   Fundal height is 37.   Estimated fetal weight is 6#.   Fetal presentation: vertex  Introitus: Normal vulva. Normal vagina.    Physical Exam  Constitutional: She appears well-developed.  Cardiovascular: Normal rate and regular rhythm.  Respiratory: Effort normal.  GI: Soft.  Genitourinary: Vagina normal.  Neurological: She is alert.  Psychiatric: She has a normal mood and affect.    Prenatal labs: ABO, Rh: --/--/O POS (06/06 1347) Antibody: NEG (06/06 1347) Rubella: Immune (11/21 0000) RPR: Nonreactive (11/21 0000)  HBsAg: Negative (11/21 0000)  HIV: Non-reactive (11/21 0000)  GBS: Negative (05/15 0000)  One hour GCT 146 Three hour GTT WNL  Assessment/Plan: Pt for repeat c-section given severe oligohydramnios.   We discussed risks/benefits of c-section in detail including bleeding, infection, possible damage to bowel and bladder.  Pt desires to proceed.   Oliver PilaKathy W Kalief Kattner 07/08/2017, 10:55 PM

## 2017-07-08 NOTE — Anesthesia Preprocedure Evaluation (Addendum)
Anesthesia Evaluation  Patient identified by MRN, date of birth, ID band Patient awake    Reviewed: Allergy & Precautions, NPO status , Patient's Chart, lab work & pertinent test results  Airway Mallampati: I       Dental no notable dental hx. (+) Teeth Intact   Pulmonary former smoker,    Pulmonary exam normal breath sounds clear to auscultation       Cardiovascular negative cardio ROS Normal cardiovascular exam Rhythm:Regular Rate:Normal     Neuro/Psych negative neurological ROS  negative psych ROS   GI/Hepatic negative GI ROS, Neg liver ROS,   Endo/Other  negative endocrine ROS  Renal/GU negative Renal ROS  negative genitourinary   Musculoskeletal negative musculoskeletal ROS (+)   Abdominal (+) + obese,   Peds  Hematology negative hematology ROS (+)   Anesthesia Other Findings   Reproductive/Obstetrics (+) Pregnancy                            Anesthesia Physical Anesthesia Plan  ASA: II  Anesthesia Plan: Spinal   Post-op Pain Management:    Induction:   PONV Risk Score and Plan: 3 and Ondansetron, Dexamethasone and Scopolamine patch - Pre-op  Airway Management Planned: Natural Airway and Nasal Cannula  Additional Equipment:   Intra-op Plan:   Post-operative Plan:   Informed Consent: I have reviewed the patients History and Physical, chart, labs and discussed the procedure including the risks, benefits and alternatives for the proposed anesthesia with the patient or authorized representative who has indicated his/her understanding and acceptance.     Plan Discussed with: CRNA and Surgeon  Anesthesia Plan Comments:        Anesthesia Quick Evaluation

## 2017-07-09 ENCOUNTER — Inpatient Hospital Stay (HOSPITAL_COMMUNITY): Payer: 59 | Admitting: Anesthesiology

## 2017-07-09 ENCOUNTER — Encounter (HOSPITAL_COMMUNITY)
Admission: AD | Disposition: A | Discharge status: Home / Self Care | Payer: Self-pay | Source: Home / Self Care | Attending: Obstetrics and Gynecology

## 2017-07-09 ENCOUNTER — Encounter (HOSPITAL_COMMUNITY): Payer: Self-pay

## 2017-07-09 ENCOUNTER — Inpatient Hospital Stay (HOSPITAL_COMMUNITY)
Admission: AD | Admit: 2017-07-09 | Discharge: 2017-07-11 | DRG: 787 | Disposition: A | Discharge status: Home / Self Care | Payer: 59 | Attending: Obstetrics and Gynecology | Admitting: Obstetrics and Gynecology

## 2017-07-09 ENCOUNTER — Other Ambulatory Visit: Payer: Self-pay

## 2017-07-09 ENCOUNTER — Inpatient Hospital Stay (HOSPITAL_COMMUNITY): Admit: 2017-07-09 | Payer: 59 | Admitting: Obstetrics and Gynecology

## 2017-07-09 DIAGNOSIS — Z87891 Personal history of nicotine dependence: Secondary | ICD-10-CM

## 2017-07-09 DIAGNOSIS — O4103X0 Oligohydramnios, third trimester, not applicable or unspecified: Secondary | ICD-10-CM | POA: Diagnosis present

## 2017-07-09 DIAGNOSIS — O34211 Maternal care for low transverse scar from previous cesarean delivery: Secondary | ICD-10-CM | POA: Diagnosis not present

## 2017-07-09 DIAGNOSIS — O34219 Maternal care for unspecified type scar from previous cesarean delivery: Secondary | ICD-10-CM | POA: Diagnosis not present

## 2017-07-09 DIAGNOSIS — Z3A38 38 weeks gestation of pregnancy: Secondary | ICD-10-CM

## 2017-07-09 DIAGNOSIS — Z98891 History of uterine scar from previous surgery: Secondary | ICD-10-CM

## 2017-07-09 DIAGNOSIS — O411230 Chorioamnionitis, third trimester, not applicable or unspecified: Secondary | ICD-10-CM | POA: Diagnosis not present

## 2017-07-09 DIAGNOSIS — Z3A00 Weeks of gestation of pregnancy not specified: Secondary | ICD-10-CM | POA: Diagnosis not present

## 2017-07-09 LAB — RPR: RPR Ser Ql: NONREACTIVE

## 2017-07-09 SURGERY — Surgical Case
Anesthesia: Regional

## 2017-07-09 SURGERY — Surgical Case
Anesthesia: Spinal

## 2017-07-09 MED ORDER — ATROPINE SULFATE 0.4 MG/ML IJ SOLN
INTRAMUSCULAR | Status: AC
  Filled 2017-07-09: qty 1

## 2017-07-09 MED ORDER — CEFAZOLIN SODIUM-DEXTROSE 2-4 GM/100ML-% IV SOLN
2.0000 g | INTRAVENOUS | Status: AC
  Administered 2017-07-09: 2 g via INTRAVENOUS
  Filled 2017-07-09: qty 100

## 2017-07-09 MED ORDER — NALBUPHINE HCL 10 MG/ML IJ SOLN: 5.0000 mg | INTRAMUSCULAR | Status: DC | PRN

## 2017-07-09 MED ORDER — BUPIVACAINE IN DEXTROSE 0.75-8.25 % IT SOLN
INTRATHECAL | Status: DC | PRN
  Administered 2017-07-09: 1.4 mL via INTRATHECAL

## 2017-07-09 MED ORDER — PROMETHAZINE HCL 25 MG/ML IJ SOLN: 6.2500 mg | INTRAMUSCULAR | Status: DC | PRN

## 2017-07-09 MED ORDER — ONDANSETRON HCL 4 MG/2ML IJ SOLN: 4.0000 mg | Freq: Three times a day (TID) | INTRAMUSCULAR | Status: DC | PRN

## 2017-07-09 MED ORDER — DIPHENHYDRAMINE HCL 50 MG/ML IJ SOLN: 12.5000 mg | INTRAMUSCULAR | Status: DC | PRN

## 2017-07-09 MED ORDER — KETOROLAC TROMETHAMINE 30 MG/ML IJ SOLN: 30.0000 mg | Freq: Four times a day (QID) | INTRAMUSCULAR | Status: AC | PRN

## 2017-07-09 MED ORDER — SENNOSIDES-DOCUSATE SODIUM 8.6-50 MG PO TABS
2.0000 | ORAL_TABLET | ORAL | Status: DC
  Administered 2017-07-10 (×2): 2 via ORAL
  Filled 2017-07-09 (×2): qty 2

## 2017-07-09 MED ORDER — DEXAMETHASONE SODIUM PHOSPHATE 4 MG/ML IJ SOLN
INTRAMUSCULAR | Status: DC | PRN
  Administered 2017-07-09: 4 mg via INTRAVENOUS

## 2017-07-09 MED ORDER — SODIUM CHLORIDE 0.9 % IR SOLN
Status: DC | PRN
  Administered 2017-07-09: 1000 mL

## 2017-07-09 MED ORDER — MEPERIDINE HCL 25 MG/ML IJ SOLN: 6.2500 mg | INTRAMUSCULAR | Status: DC | PRN

## 2017-07-09 MED ORDER — COCONUT OIL OIL: 1.0000 "application " | TOPICAL_OIL | Status: DC | PRN

## 2017-07-09 MED ORDER — ONDANSETRON HCL 4 MG/2ML IJ SOLN
INTRAMUSCULAR | Status: AC
  Filled 2017-07-09: qty 2

## 2017-07-09 MED ORDER — KETOROLAC TROMETHAMINE 30 MG/ML IJ SOLN
30.0000 mg | Freq: Four times a day (QID) | INTRAMUSCULAR | Status: AC | PRN
  Administered 2017-07-09: 30 mg via INTRAVENOUS
  Filled 2017-07-09: qty 1

## 2017-07-09 MED ORDER — OXYTOCIN 10 UNIT/ML IJ SOLN
INTRAMUSCULAR | Status: AC
  Filled 2017-07-09: qty 4

## 2017-07-09 MED ORDER — SODIUM CHLORIDE 0.9% FLUSH: 3.0000 mL | INTRAVENOUS | Status: DC | PRN

## 2017-07-09 MED ORDER — OXYTOCIN 10 UNIT/ML IJ SOLN
INTRAVENOUS | Status: DC | PRN
  Administered 2017-07-09: 40 [IU] via INTRAVENOUS

## 2017-07-09 MED ORDER — SIMETHICONE 80 MG PO CHEW
80.0000 mg | CHEWABLE_TABLET | Freq: Three times a day (TID) | ORAL | Status: DC
  Administered 2017-07-09 – 2017-07-11 (×7): 80 mg via ORAL
  Filled 2017-07-09 (×7): qty 1

## 2017-07-09 MED ORDER — OXYCODONE HCL 5 MG PO TABS
10.0000 mg | ORAL_TABLET | ORAL | Status: DC | PRN
  Administered 2017-07-11 (×3): 10 mg via ORAL
  Filled 2017-07-09 (×2): qty 2

## 2017-07-09 MED ORDER — SCOPOLAMINE 1 MG/3DAYS TD PT72: 1.0000 | MEDICATED_PATCH | Freq: Once | TRANSDERMAL | Status: DC

## 2017-07-09 MED ORDER — STERILE WATER FOR IRRIGATION IR SOLN
Status: DC | PRN
  Administered 2017-07-09: 1000 mL

## 2017-07-09 MED ORDER — MORPHINE SULFATE (PF) 0.5 MG/ML IJ SOLN
INTRAMUSCULAR | Status: DC | PRN
  Administered 2017-07-09: .2 mg via INTRATHECAL

## 2017-07-09 MED ORDER — PHENYLEPHRINE 8 MG IN D5W 100 ML (0.08MG/ML) PREMIX OPTIME
INJECTION | INTRAVENOUS | Status: AC
  Filled 2017-07-09: qty 100

## 2017-07-09 MED ORDER — DIPHENHYDRAMINE HCL 25 MG PO CAPS: 25.0000 mg | ORAL_CAPSULE | Freq: Four times a day (QID) | ORAL | Status: DC | PRN

## 2017-07-09 MED ORDER — LACTATED RINGERS IV SOLN
INTRAVENOUS | Status: DC
  Administered 2017-07-09 (×2): via INTRAVENOUS

## 2017-07-09 MED ORDER — DIPHENHYDRAMINE HCL 25 MG PO CAPS
25.0000 mg | ORAL_CAPSULE | ORAL | Status: DC | PRN
  Filled 2017-07-09: qty 1

## 2017-07-09 MED ORDER — DEXAMETHASONE SODIUM PHOSPHATE 4 MG/ML IJ SOLN
INTRAMUSCULAR | Status: AC
  Filled 2017-07-09: qty 1

## 2017-07-09 MED ORDER — ZOLPIDEM TARTRATE 5 MG PO TABS: 5.0000 mg | ORAL_TABLET | Freq: Every evening | ORAL | Status: DC | PRN

## 2017-07-09 MED ORDER — IBUPROFEN 600 MG PO TABS
600.0000 mg | ORAL_TABLET | Freq: Four times a day (QID) | ORAL | Status: DC
  Administered 2017-07-09 – 2017-07-11 (×9): 600 mg via ORAL
  Filled 2017-07-09 (×9): qty 1

## 2017-07-09 MED ORDER — ONDANSETRON HCL 4 MG/2ML IJ SOLN
INTRAMUSCULAR | Status: DC | PRN
  Administered 2017-07-09: 4 mg via INTRAVENOUS

## 2017-07-09 MED ORDER — SIMETHICONE 80 MG PO CHEW: 80.0000 mg | CHEWABLE_TABLET | ORAL | Status: DC | PRN

## 2017-07-09 MED ORDER — ACETAMINOPHEN 500 MG PO TABS
1000.0000 mg | ORAL_TABLET | Freq: Four times a day (QID) | ORAL | Status: AC
  Administered 2017-07-09 – 2017-07-10 (×3): 1000 mg via ORAL
  Filled 2017-07-09 (×3): qty 2

## 2017-07-09 MED ORDER — NALOXONE HCL 0.4 MG/ML IJ SOLN: 0.4000 mg | INTRAMUSCULAR | Status: DC | PRN

## 2017-07-09 MED ORDER — SIMETHICONE 80 MG PO CHEW
80.0000 mg | CHEWABLE_TABLET | ORAL | Status: DC
  Administered 2017-07-10 (×2): 80 mg via ORAL
  Filled 2017-07-09 (×2): qty 1

## 2017-07-09 MED ORDER — MENTHOL 3 MG MT LOZG: 1.0000 | LOZENGE | OROMUCOSAL | Status: DC | PRN

## 2017-07-09 MED ORDER — PRENATAL MULTIVITAMIN CH
1.0000 | ORAL_TABLET | Freq: Every day | ORAL | Status: DC
  Administered 2017-07-10 – 2017-07-11 (×2): 1 via ORAL
  Filled 2017-07-09 (×2): qty 1

## 2017-07-09 MED ORDER — NALOXONE HCL 4 MG/10ML IJ SOLN
1.0000 ug/kg/h | INTRAVENOUS | Status: DC | PRN
  Filled 2017-07-09: qty 5

## 2017-07-09 MED ORDER — SCOPOLAMINE 1 MG/3DAYS TD PT72
MEDICATED_PATCH | TRANSDERMAL | Status: DC | PRN
  Administered 2017-07-09: 1 via TRANSDERMAL

## 2017-07-09 MED ORDER — SCOPOLAMINE 1 MG/3DAYS TD PT72
MEDICATED_PATCH | TRANSDERMAL | Status: AC
  Filled 2017-07-09: qty 1

## 2017-07-09 MED ORDER — HYDROMORPHONE HCL 1 MG/ML IJ SOLN: 0.2500 mg | INTRAMUSCULAR | Status: DC | PRN

## 2017-07-09 MED ORDER — TETANUS-DIPHTH-ACELL PERTUSSIS 5-2.5-18.5 LF-MCG/0.5 IM SUSP: 0.5000 mL | Freq: Once | INTRAMUSCULAR | Status: DC

## 2017-07-09 MED ORDER — KETOROLAC TROMETHAMINE 30 MG/ML IJ SOLN: 30.0000 mg | Freq: Once | INTRAMUSCULAR | Status: DC | PRN

## 2017-07-09 MED ORDER — WITCH HAZEL-GLYCERIN EX PADS: 1.0000 "application " | MEDICATED_PAD | CUTANEOUS | Status: DC | PRN

## 2017-07-09 MED ORDER — NALBUPHINE HCL 10 MG/ML IJ SOLN: 5.0000 mg | Freq: Once | INTRAMUSCULAR | Status: DC | PRN

## 2017-07-09 MED ORDER — OXYTOCIN 40 UNITS IN LACTATED RINGERS INFUSION - SIMPLE MED: 2.5000 [IU]/h | INTRAVENOUS | Status: AC

## 2017-07-09 MED ORDER — PHENYLEPHRINE 8 MG IN D5W 100 ML (0.08MG/ML) PREMIX OPTIME
INJECTION | INTRAVENOUS | Status: DC | PRN
Start: 2017-07-09 — End: 2017-07-09
  Administered 2017-07-09: 60 ug/min via INTRAVENOUS

## 2017-07-09 MED ORDER — FENTANYL CITRATE (PF) 100 MCG/2ML IJ SOLN
INTRAMUSCULAR | Status: AC
  Filled 2017-07-09: qty 2

## 2017-07-09 MED ORDER — MORPHINE SULFATE (PF) 0.5 MG/ML IJ SOLN
INTRAMUSCULAR | Status: AC
Start: 2017-07-09 — End: ?
  Filled 2017-07-09: qty 10

## 2017-07-09 MED ORDER — ACETAMINOPHEN 325 MG PO TABS
650.0000 mg | ORAL_TABLET | ORAL | Status: DC | PRN
Start: 2017-07-09 — End: 2017-07-11
  Administered 2017-07-10 – 2017-07-11 (×3): 650 mg via ORAL
  Filled 2017-07-09 (×2): qty 2

## 2017-07-09 MED ORDER — LACTATED RINGERS IV SOLN
INTRAVENOUS | Status: DC
  Administered 2017-07-09: 22:00:00 via INTRAVENOUS

## 2017-07-09 MED ORDER — FENTANYL CITRATE (PF) 100 MCG/2ML IJ SOLN
INTRAMUSCULAR | Status: DC | PRN
  Administered 2017-07-09: 20 ug via INTRATHECAL

## 2017-07-09 MED ORDER — OXYCODONE HCL 5 MG PO TABS
5.0000 mg | ORAL_TABLET | ORAL | Status: DC | PRN
  Administered 2017-07-10 – 2017-07-11 (×4): 5 mg via ORAL
  Filled 2017-07-09 (×4): qty 1

## 2017-07-09 MED ORDER — DIBUCAINE 1 % RE OINT: 1.0000 "application " | TOPICAL_OINTMENT | RECTAL | Status: DC | PRN

## 2017-07-09 SURGICAL SUPPLY — 37 items
APL SKNCLS STERI-STRIP NONHPOA (GAUZE/BANDAGES/DRESSINGS) ×1
BENZOIN TINCTURE PRP APPL 2/3 (GAUZE/BANDAGES/DRESSINGS) ×2 IMPLANT
CHLORAPREP W/TINT 26ML (MISCELLANEOUS) ×3 IMPLANT
CLAMP CORD UMBIL (MISCELLANEOUS) IMPLANT
CLOSURE WOUND 1/2 X4 (GAUZE/BANDAGES/DRESSINGS) ×1
CLOTH BEACON ORANGE TIMEOUT ST (SAFETY) ×3 IMPLANT
DRSG OPSITE POSTOP 4X10 (GAUZE/BANDAGES/DRESSINGS) ×3 IMPLANT
ELECT REM PT RETURN 9FT ADLT (ELECTROSURGICAL) ×3
ELECTRODE REM PT RTRN 9FT ADLT (ELECTROSURGICAL) ×1 IMPLANT
EXTRACTOR VACUUM KIWI (MISCELLANEOUS) IMPLANT
GLOVE BIO SURGEON STRL SZ 6.5 (GLOVE) ×2 IMPLANT
GLOVE BIO SURGEONS STRL SZ 6.5 (GLOVE) ×1
GLOVE BIOGEL PI IND STRL 7.0 (GLOVE) ×1 IMPLANT
GLOVE BIOGEL PI INDICATOR 7.0 (GLOVE) ×2
GOWN STRL REUS W/TWL LRG LVL3 (GOWN DISPOSABLE) ×6 IMPLANT
KIT ABG SYR 3ML LUER SLIP (SYRINGE) IMPLANT
NDL HYPO 25X5/8 SAFETYGLIDE (NEEDLE) IMPLANT
NEEDLE HYPO 25X5/8 SAFETYGLIDE (NEEDLE) IMPLANT
NS IRRIG 1000ML POUR BTL (IV SOLUTION) ×3 IMPLANT
PACK C SECTION WH (CUSTOM PROCEDURE TRAY) ×3 IMPLANT
PAD OB MATERNITY 4.3X12.25 (PERSONAL CARE ITEMS) ×3 IMPLANT
PENCIL SMOKE EVAC W/HOLSTER (ELECTROSURGICAL) ×3 IMPLANT
RTRCTR C-SECT PINK 25CM LRG (MISCELLANEOUS) ×3 IMPLANT
STRIP CLOSURE SKIN 1/2X4 (GAUZE/BANDAGES/DRESSINGS) ×1 IMPLANT
SUT CHROMIC 1 CTX 36 (SUTURE) ×6 IMPLANT
SUT PLAIN 0 NONE (SUTURE) IMPLANT
SUT PLAIN 2 0 XLH (SUTURE) ×3 IMPLANT
SUT VIC AB 0 CT1 27 (SUTURE) ×6
SUT VIC AB 0 CT1 27XBRD ANBCTR (SUTURE) ×2 IMPLANT
SUT VIC AB 2-0 CT1 (SUTURE) ×3 IMPLANT
SUT VIC AB 2-0 CT1 27 (SUTURE) ×3
SUT VIC AB 2-0 CT1 TAPERPNT 27 (SUTURE) ×1 IMPLANT
SUT VIC AB 3-0 CT1 27 (SUTURE)
SUT VIC AB 3-0 CT1 TAPERPNT 27 (SUTURE) IMPLANT
SUT VIC AB 4-0 KS 27 (SUTURE) ×3 IMPLANT
TOWEL OR 17X24 6PK STRL BLUE (TOWEL DISPOSABLE) ×3 IMPLANT
TRAY FOLEY W/BAG SLVR 14FR LF (SET/KITS/TRAYS/PACK) ×3 IMPLANT

## 2017-07-09 NOTE — Progress Notes (Signed)
Patient ID: Wendy Harding, female   DOB: [birth year], 37 y.o.   MRN: [id number] Per pt no changes in dictated H&P.  Brief exam WNL.  Ready to proceed.

## 2017-07-09 NOTE — Op Note (Signed)
Operative Note    Preoperative Diagnosis Term pregnancy at 38 2/7 weeks Severe range oligohydramnios  Postoperative Diagnosis Same with meconium stained fluid  Procedure Repeat low transverse c-section  Surgeon Huel CoteKathy Ronell Boldin, MD Genice Rougeracey Tucker, RNFA  Anesthesia Spinal  Fluids: EBL 600mL UOP 150mL clear IVF 2200ml LR  Findings Viable female infant in the vertex presentation, Apgars 8,9 Weight pending (unofficial 5#8oz) Placenta small with thin cord.  Pelvic anatomy WNL except for LUS varicosities  Specimen Placenta to pathology  Procedure Note Patient was taken to the operating room where spinal anesthesia was obtained and found to be adequate by Allis clamp test. She was prepped and draped in the normal sterile fashion in the dorsal supine position with a leftward tilt. An appropriate time out was performed. A Pfannenstiel skin incision was then made through a pre-existing scar with the scalpel and carried through to the underlying layer of fascia by sharp dissection and Bovie cautery. The fascia was nicked in the midline and the incision was extended laterally with Mayo scissors. The inferior aspect of the incision was grasped Coker clamps and dissected off the underlying rectus muscles. In a similar fashion the superior aspect was dissected off the rectus muscles. Rectus muscles were separated in the midline and the peritoneal cavity entered bluntly. The peritoneal incision was then extended both superiorly and inferiorly with careful attention to avoid both bowel and bladder. The Alexis self-retaining wound retractor was then placed within the incision and the lower uterine segment exposed. The bladder flap was developed with Metzenbaum scissors and pushed away from the lower uterine segment. The lower uterine segment was then incised in a transverse fashion and the cavity itself entered bluntly. The incision was extended bluntly. The infant's head was then lifted and delivered  from the incision without difficulty. The remainder of the infant delivered and the nose and mouth bulb suctioned with the cord clamped and cut as well. The infant was handed off to the waiting pediatricians. The placenta was then manually expressed from the uterus and the uterus cleared of all clots and debris with moist lap sponge. The uterine incision was then repaired in 2 layers the first layer was a running locked layer of 1-0 chromic and the second an imbricating layer of the same suture. The tubes and ovaries were inspected and the gutters cleared of all clots and debris. The uterine incision was inspected and found to be hemostatic. All instruments and sponges as well as the Alexis retractor were then removed from the abdomen. The rectus muscles and peritoneum were then reapproximated with a running suture of 2-0 Vicryl. The fascia was then closed with 0 Vicryl in a running fashion. Subcutaneous tissue was reapproximated with 3-0 plain in a running fashion. The skin was closed with a subcuticular stitch of 4-0 Vicryl on a Keith needle and then reinforced with benzoin and Steri-Strips. At the conclusion of the procedure all instruments and sponge counts were correct. She was taken to the recovery room in good condition with baby accompanying her skin-to-skin.

## 2017-07-09 NOTE — Anesthesia Procedure Notes (Signed)
Spinal  Patient location during procedure: OR Start time: 07/09/2017 7:54 AM End time: 07/09/2017 7:58 AM Staffing Anesthesiologist: Leilani AbleHatchett, Lamika Connolly, MD Performed: anesthesiologist  Preanesthetic Checklist Completed: patient identified, site marked, surgical consent, pre-op evaluation, timeout performed, IV checked, risks and benefits discussed and monitors and equipment checked Spinal Block Patient position: sitting Prep: site prepped and draped and DuraPrep Patient monitoring: continuous pulse ox and blood pressure Approach: midline Location: L3-4 Injection technique: single-shot Needle Needle type: Pencan  Needle gauge: 24 G Needle length: 10 cm Needle insertion depth: 5 cm Assessment Sensory level: T4

## 2017-07-09 NOTE — Lactation Note (Signed)
This note was copied from a baby's chart. Lactation Consultation Note  Patient Name: Wendy Harding ZOXWR'UToday's Date: 07/09/2017 Reason for consult: Initial assessment;Early term 37-38.6wks;Infant < 6lbs  P2 mother whose infant is now 7311 hours old.  Mother breastfed her first childn for 6 weeks.  She stated that her milk supply was low.  Mother attempting to latch infant onto the right breast as I arrived.  Infant restless.  Diaper checked and burped.  I assisted baby to latch in the cross cradle hold on the left breast without difficulty.  Lips flanged and baby rhythmically sucking.  Mother's breasts are soft and non tender and nipples are everted with no breakdown.  Baby still feeding actively when I left the room.  Encouraged mother to feed 8-12 times/24 hours or earlier if she shows feeding cues.  Mother will continue breast compressions and hand expression.  Colostrum container provided.  Mother has a DEBP at bedside and is going to pump for 15 minutes after every feeding to help increase milk supply and provide supplementation for baby.    Mother will call for latch assistance as needed.  Mom made aware of O/P services, breastfeeding support groups, community resources, and our phone # for post-discharge questions.    Maternal Data Formula Feeding for Exclusion: No Has patient been taught Hand Expression?: Yes Does the patient have breastfeeding experience prior to this delivery?: Yes  Feeding Feeding Type: Breast Fed Length of feed: 10 min(still feeding11)  LATCH Score Latch: Grasps breast easily, tongue down, lips flanged, rhythmical sucking.  Audible Swallowing: A few with stimulation  Type of Nipple: Everted at rest and after stimulation  Comfort (Breast/Nipple): Soft / non-tender  Hold (Positioning): Assistance needed to correctly position infant at breast and maintain latch.  LATCH Score: 8  Interventions Interventions: Breast feeding basics reviewed;Assisted with  latch;Skin to skin;Breast massage;Position options;Support pillows;Adjust position;Breast compression;DEBP  Lactation Tools Discussed/Used     Consult Status Consult Status: Follow-up Date: 07/10/17    Irene PapBeth R Parminder Trapani 07/09/2017, 7:49 PM

## 2017-07-09 NOTE — Anesthesia Postprocedure Evaluation (Signed)
Anesthesia Post Note  Patient: Wendy Harding  Procedure(s) Performed: REPEAT CESAREAN SECTION (N/A )     Patient location during evaluation: PACU Anesthesia Type: Spinal Level of consciousness: awake Pain management: pain level controlled Vital Signs Assessment: post-procedure vital signs reviewed and stable Respiratory status: spontaneous breathing Cardiovascular status: stable Postop Assessment: no headache, no backache, spinal receding, patient able to bend at knees and no apparent nausea or vomiting Anesthetic complications: no    Last Vitals:  Vitals:   07/09/17 1030 07/09/17 1053  BP: 121/67 134/87  Pulse: 63 (!) 49  Resp: 14 16  Temp:  36.4 C  SpO2: 97% 99%    Last Pain:  Vitals:   07/09/17 1037  TempSrc:   PainSc: 0-No pain   Pain Goal: Patients Stated Pain Goal: 2 (07/09/17 0631)               Shaunessy Dobratz JR,JOHN Susann GivensFRANKLIN

## 2017-07-09 NOTE — Transfer of Care (Signed)
Immediate Anesthesia Transfer of Care Note  Patient: Wendy Harding  Procedure(s) Performed: REPEAT CESAREAN SECTION (N/A )  Patient Location: PACU  Anesthesia Type:Spinal  Level of Consciousness: awake, alert  and oriented  Airway & Oxygen Therapy: Patient Spontanous Breathing  Post-op Assessment: Report given to RN and Post -op Vital signs reviewed and stable  Post vital signs: Reviewed and stable HR 52, RR 16, SaO2 98%, BP 120/49  Last Vitals:  Vitals Value Taken Time  BP 120/49 07/09/2017  9:16 AM  Temp    Pulse 53 07/09/2017  9:20 AM  Resp 17 07/09/2017  9:20 AM  SpO2 99 % 07/09/2017  9:20 AM  Vitals shown include unvalidated device data.  Last Pain:  Vitals:   07/09/17 0631  TempSrc:   PainSc: 3       Patients Stated Pain Goal: 2 (07/09/17 0631)  Complications: No apparent anesthesia complications

## 2017-07-10 LAB — CBC
HCT: 28.6 % — ABNORMAL LOW (ref 36.0–46.0)
Hemoglobin: 9.8 g/dL — ABNORMAL LOW (ref 12.0–15.0)
MCH: 29.3 pg (ref 26.0–34.0)
MCHC: 34.3 g/dL (ref 30.0–36.0)
MCV: 85.4 fL (ref 78.0–100.0)
PLATELETS: 189 10*3/uL (ref 150–400)
RBC: 3.35 MIL/uL — ABNORMAL LOW (ref 3.87–5.11)
RDW: 14 % (ref 11.5–15.5)
WBC: 12.9 10*3/uL — ABNORMAL HIGH (ref 4.0–10.5)

## 2017-07-10 NOTE — Lactation Note (Signed)
This note was copied from a baby's chart. Lactation Consultation Note  Patient Name: Wendy Harding Spurr WUJWJ'XToday's Date: 07/10/2017 Reason for consult: Follow-up assessment;Early term 37-38.6wks;Infant < 6lbs;Difficult latch  LC Follow Up Per Mother's Request:  Mother requesting latch assistance.  Baby is awake and alert but mother states that latching her to the left breast is difficult.  Mother's breasts are soft and non tender with everted nipples.  Baby has small mouth so reminded mother to obtain a wide mouth prior to latching.    Assisted baby to latch in the football hold on the left breast without difficulty.  Wide mouth with flanged lips and a few audible swallows noted.  Encouraged mother to hold baby into breast with good body alignment.  Mother verbalized understanding.  Mother will feed 8-12 times/24 hours or earlier if baby shows feeding cues.  I asked her to awaken baby at the 3 hour interval tonight to feed due to weight and jaundice level.  Mother will supplement with formula per recommended amounts immediately after breastfeeding.  Mother prefers to use a bottle nipple for supplementation even though I offered other suggestions.  Mother will also post pump for 15 minutes after feedings to increase milk supply for supplementation for baby.  RN in room and aware of feeding plan for tonight.  Father present and supportive.   Maternal Data Formula Feeding for Exclusion: No Has patient been taught Hand Expression?: Yes Does the patient have breastfeeding experience prior to this delivery?: Yes  Feeding Feeding Type: Breast Fed Length of feed: 20 min(still feeding)  LATCH Score Latch: Grasps breast easily, tongue down, lips flanged, rhythmical sucking.  Audible Swallowing: A few with stimulation  Type of Nipple: Everted at rest and after stimulation  Comfort (Breast/Nipple): Soft / non-tender  Hold (Positioning): Assistance needed to correctly position infant at breast and  maintain latch.  LATCH Score: 8  Interventions Interventions: Assisted with latch;Skin to skin;Breast massage;Hand express;Position options;Support pillows;Adjust position;Breast compression  Lactation Tools Discussed/Used     Consult Status Consult Status: Follow-up Date: 07/11/17 Follow-up type: In-patient    Timothea Bodenheimer R Reginal Wojcicki 07/10/2017, 8:52 PM

## 2017-07-10 NOTE — Progress Notes (Signed)
Subjective: Postpartum Day #1: Cesarean Delivery Patient reports incisional pain, tolerating PO and no problems voiding.  Baby on bili blanket  Objective: Vital signs in last 24 hours: Temp:  [97.6 F (36.4 C)-98.6 F (37 C)] 98.6 F (37 C) (06/08 0820) Pulse Rate:  [49-63] 56 (06/08 0820) Resp:  [14-19] 18 (06/08 0820) BP: (109-134)/(64-87) 118/64 (06/08 0820) SpO2:  [95 %-100 %] 100 % (06/08 0820)  Physical Exam:  General: alert Lochia: appropriate Uterine Fundus: firm Incision: dressing C/D/I   Recent Labs    07/08/17 1347 07/10/17 0534  HGB 13.7 9.8*  HCT 40.1 28.6*    Assessment/Plan: Status post Cesarean section. Doing well postoperatively.  Continue current care, ambulate.  Leighton Roachodd D Akshath Mccarey 07/10/2017, 9:57 AM

## 2017-07-11 MED ORDER — IBUPROFEN 600 MG PO TABS: 600.0000 mg | ORAL_TABLET | Freq: Four times a day (QID) | ORAL | 0 refills | Status: AC

## 2017-07-11 MED ORDER — OXYCODONE HCL 5 MG PO TABS: 5.0000 mg | ORAL_TABLET | ORAL | 0 refills | Status: DC | PRN

## 2017-07-11 MED ORDER — IBUPROFEN 600 MG PO TABS: 600.0000 mg | ORAL_TABLET | Freq: Four times a day (QID) | ORAL | 0 refills | Status: AC | PRN

## 2017-07-11 NOTE — Discharge Summary (Signed)
OB Discharge Summary     Patient Name: Wendy Harding DOB: Jun 02, 1980 MRN: [id number]  Date of admission: 07/09/2017 Delivering MD: Huel CoteICHARDSON, KATHY   Date of discharge: 07/11/2017  Admitting diagnosis: repeat c-section Intrauterine pregnancy: [id number]     Secondary diagnosis:  Active Problems:   History of cesarean section   Status post repeat low transverse cesarean section      Discharge diagnosis: Term Pregnancy Delivered and previous c-section, oligohydramnios                                   Hospital course:  Sceduled C/S   37 y.o. yo G2P2002 at 2154w2d was admitted to the hospital 07/09/2017 for scheduled cesarean section with the following indication:Elective Repeat and oligohydramnios.  Membrane Rupture Time/Date: 8:23 AM ,07/09/2017   Patient delivered a Viable infant.07/09/2017  Details of operation can be found in separate operative note.  Pateint had an uncomplicated postpartum course.  She is ambulating, tolerating a regular diet, passing flatus, and urinating well. Patient is discharged home in stable condition on  07/11/17         Physical exam  Vitals:   07/10/17 0820 07/10/17 1656 07/10/17 2024 07/11/17 0513  BP: 118/64 134/86 121/77 137/85  Pulse: (!) 56 71 60 67  Resp: 18 18 18 18   Temp: 98.6 F (37 C) 97.9 F (36.6 C) 98 F (36.7 C) 98.2 F (36.8 C)  TempSrc: Oral Oral Oral Oral  SpO2: 100%  100% 100%  Weight:      Height:       General: alert Lochia: appropriate Uterine Fundus: firm Incision: Healing well with no significant drainage  Labs: Lab Results  Component Value Date   WBC 12.9 (H) 07/10/2017   HGB 9.8 (L) 07/10/2017   HCT 28.6 (L) 07/10/2017   MCV 85.4 07/10/2017   PLT 189 07/10/2017   No flowsheet data found.  Discharge instruction: per After Visit Summary and "Baby and Me Booklet".  After visit meds:  Allergies as of 07/11/2017   No Known Allergies     Medication List    TAKE these medications   acetaminophen 500 MG  tablet Commonly known as:  TYLENOL Take 500 mg by mouth daily as needed for moderate pain or headache.   calcium carbonate 500 MG chewable tablet Commonly known as:  TUMS - dosed in mg elemental calcium Chew 1-2 tablets by mouth daily as needed for indigestion or heartburn.   ibuprofen 600 MG tablet Commonly known as:  ADVIL,MOTRIN Take 1 tablet (600 mg total) by mouth every 6 (six) hours.   oxyCODONE 5 MG immediate release tablet Commonly known as:  Oxy IR/ROXICODONE Take 1 tablet (5 mg total) by mouth every 4 (four) hours as needed for severe pain.   prenatal multivitamin Tabs tablet Take 1 tablet by mouth every evening.   ranitidine 150 MG tablet Commonly known as:  ZANTAC Take 150 mg by mouth 2 (two) times daily as needed for heartburn.   SYSTANE OP Place 1 drop into both eyes daily as needed (dry eyes).       Diet: routine diet  Activity: Advance as tolerated. Pelvic rest for 6 weeks.   Outpatient follow up:2 weeks  Newborn Data: Live born female  Birth Weight: 5 lb 8.5 oz (2510 g) APGAR: 9, 9  Newborn Delivery   Birth date/time:  07/09/2017 08:25:00 Delivery type:  C-Section, Low Transverse Trial of  labor:  No C-section categorization:  Repeat     Baby Feeding: Breast Disposition:home with mother   07/11/2017 Zenaida Niece, MD

## 2017-07-11 NOTE — Lactation Note (Signed)
This note was copied from a baby's chart. Lactation Consultation Note  Patient Name: Wendy Harding ZOXWR'UToday's Date: 07/11/2017 Reason for consult: Follow-up assessment;Early term 37-38.6wks;Infant < 6lbs;Difficult latch;Other (Comment);Hyperbilirubinemia(DAT (+))  58 hours old early term baby who is now being mostly formula fed by her parents, baby was put on Similac 22 calorie formula and phototherapy for 36 hours due to bilirubin levels raising up; she had fetal/neonatal jaundice.  Mom and baby are going home today, reviewed discharge instructions and signs to watch for when to call baby's pediatrician. Engorgement prevention and treatment was also discussed. Both parents aware of OP LC services and will contact PRN.  Maternal Data    Feeding   Interventions Interventions: Breast feeding basics reviewed  Lactation Tools Discussed/Used     Consult Status Consult Status: Complete Date: 07/11/17 Follow-up type: Call as needed    Cannon Quinton Venetia ConstableS Luismiguel Lamere 07/11/2017, 6:43 PM

## 2017-07-11 NOTE — Discharge Instructions (Signed)
As per discharge pamphlet °

## 2017-07-11 NOTE — Progress Notes (Signed)
POD #2 Doing ok, still sore, oxycodone helps, baby not on bili blanket any more Afeb, VSS Abd- soft, fundus firm, incision intact Continue routine care

## 2017-07-13 ENCOUNTER — Encounter (HOSPITAL_COMMUNITY)
Admission: RE | Admit: 2017-07-13 | Discharge: 2017-07-13 | Disposition: A | Discharge status: Home / Self Care | Payer: 59 | Source: Ambulatory Visit | Attending: Obstetrics and Gynecology | Admitting: Obstetrics and Gynecology

## 2017-07-13 HISTORY — DX: Supervision of elderly multigravida, unspecified trimester: O09.529

## 2017-08-19 DIAGNOSIS — Z30430 Encounter for insertion of intrauterine contraceptive device: Secondary | ICD-10-CM | POA: Diagnosis not present

## 2017-08-19 DIAGNOSIS — Z3009 Encounter for other general counseling and advice on contraception: Secondary | ICD-10-CM | POA: Diagnosis not present

## 2017-08-19 DIAGNOSIS — Z1389 Encounter for screening for other disorder: Secondary | ICD-10-CM | POA: Diagnosis not present

## 2017-10-07 IMAGING — CR DG FOOT COMPLETE 3+V*R*
3 series · 3 of 3 positions shown · non-contrast
Comparison: None.

CLINICAL DATA: Patient dropped tray on foot

EXAM:
RIGHT FOOT COMPLETE - 3+ VIEW

[view not recorded (1 of 3)]
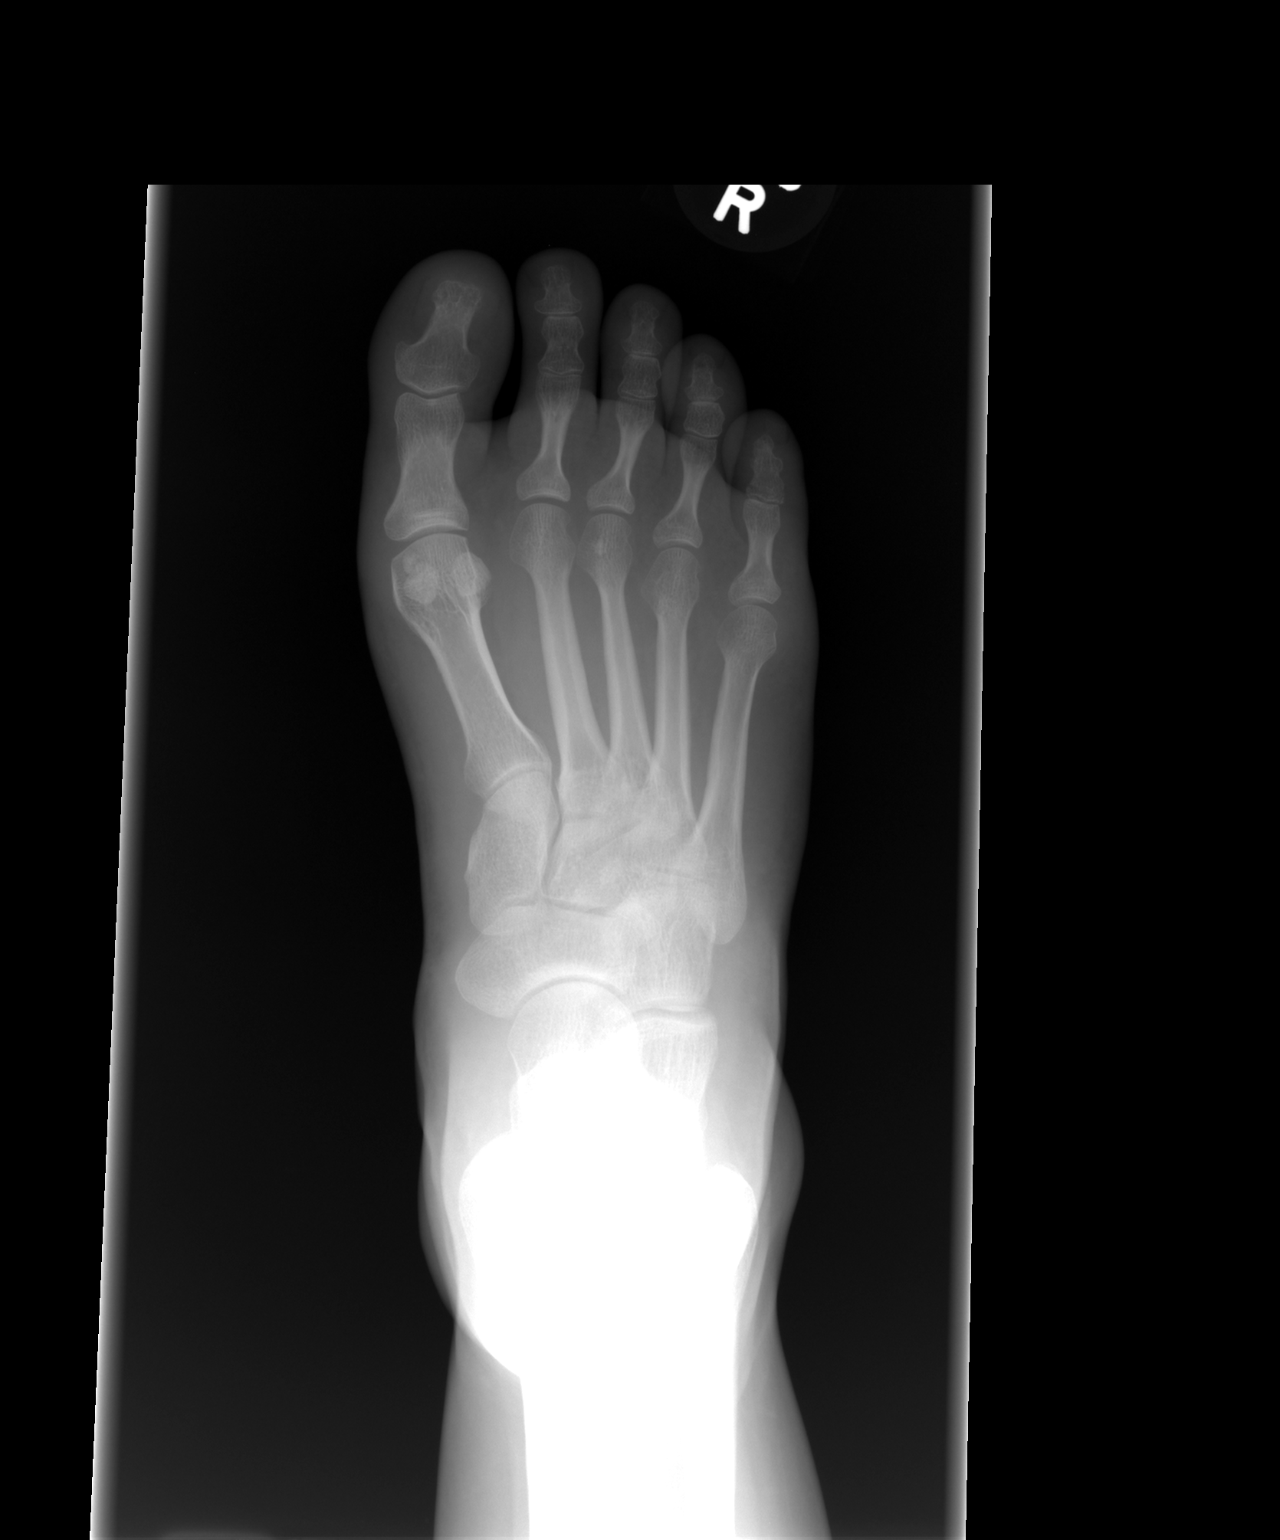

[view not recorded (2 of 3)]
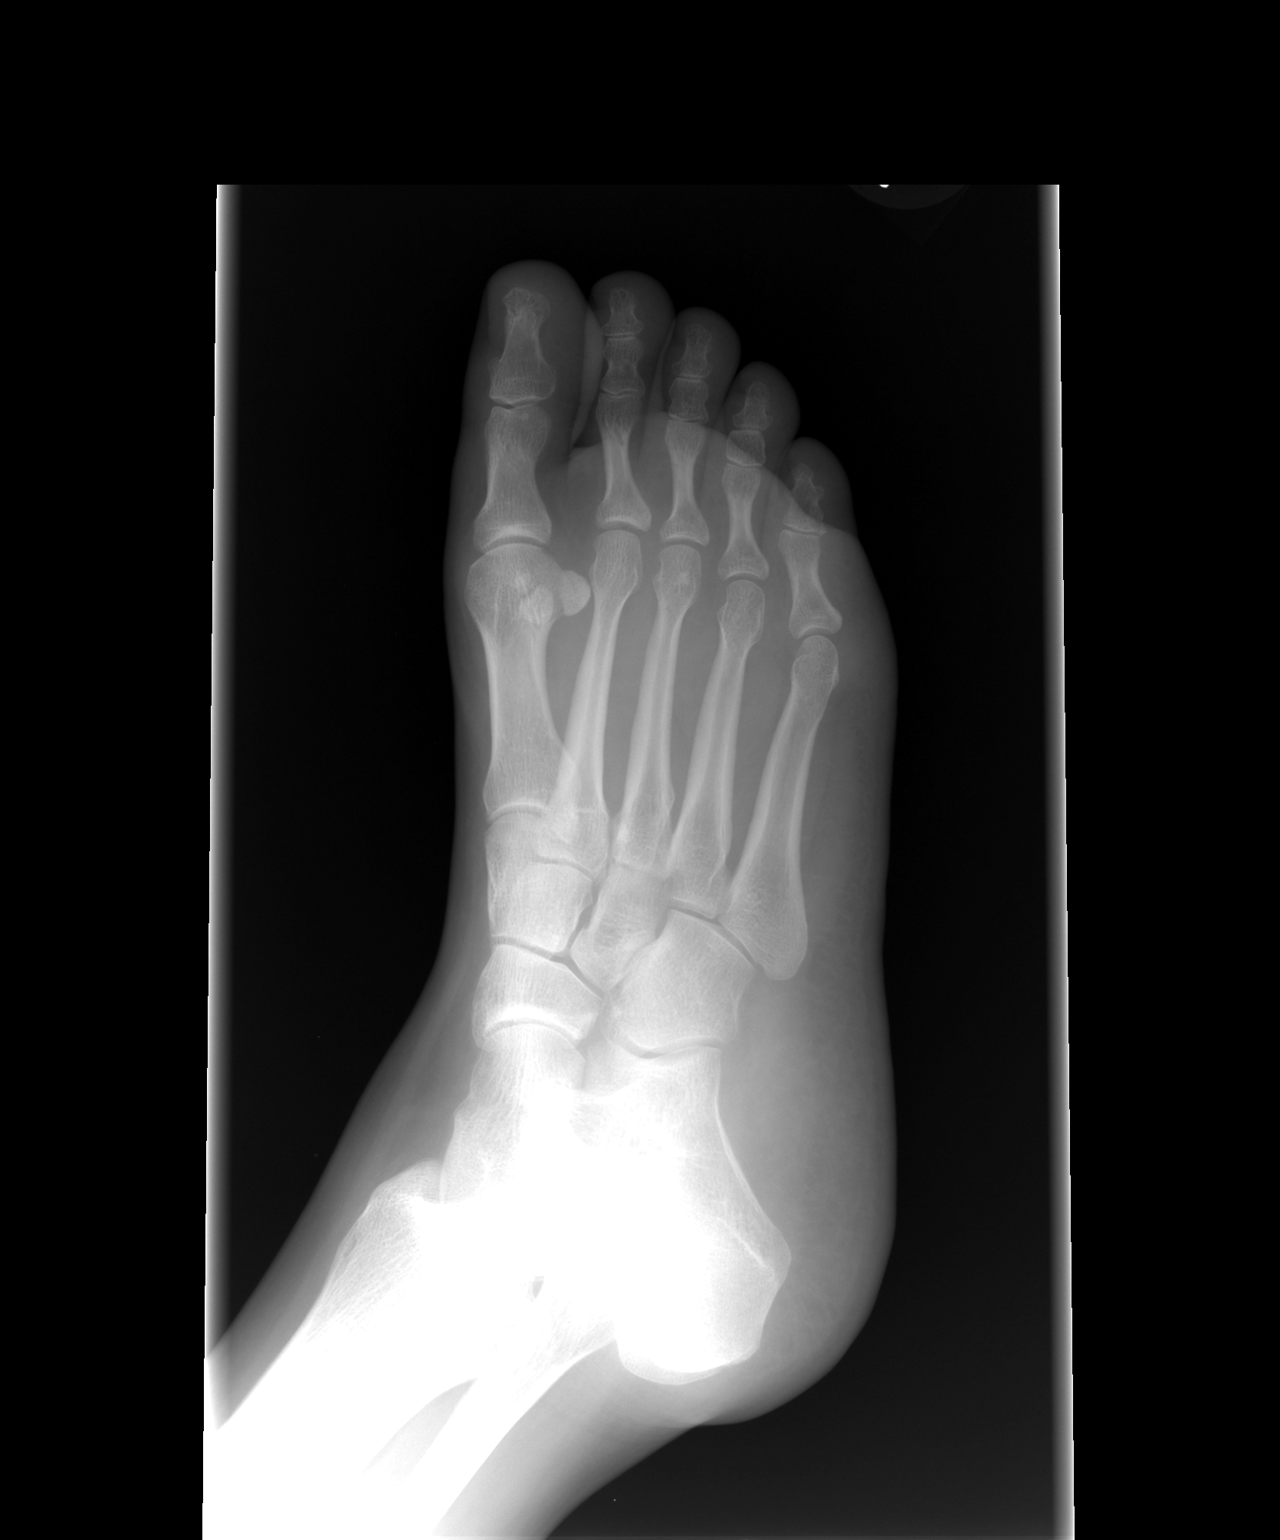

[view not recorded (3 of 3)]
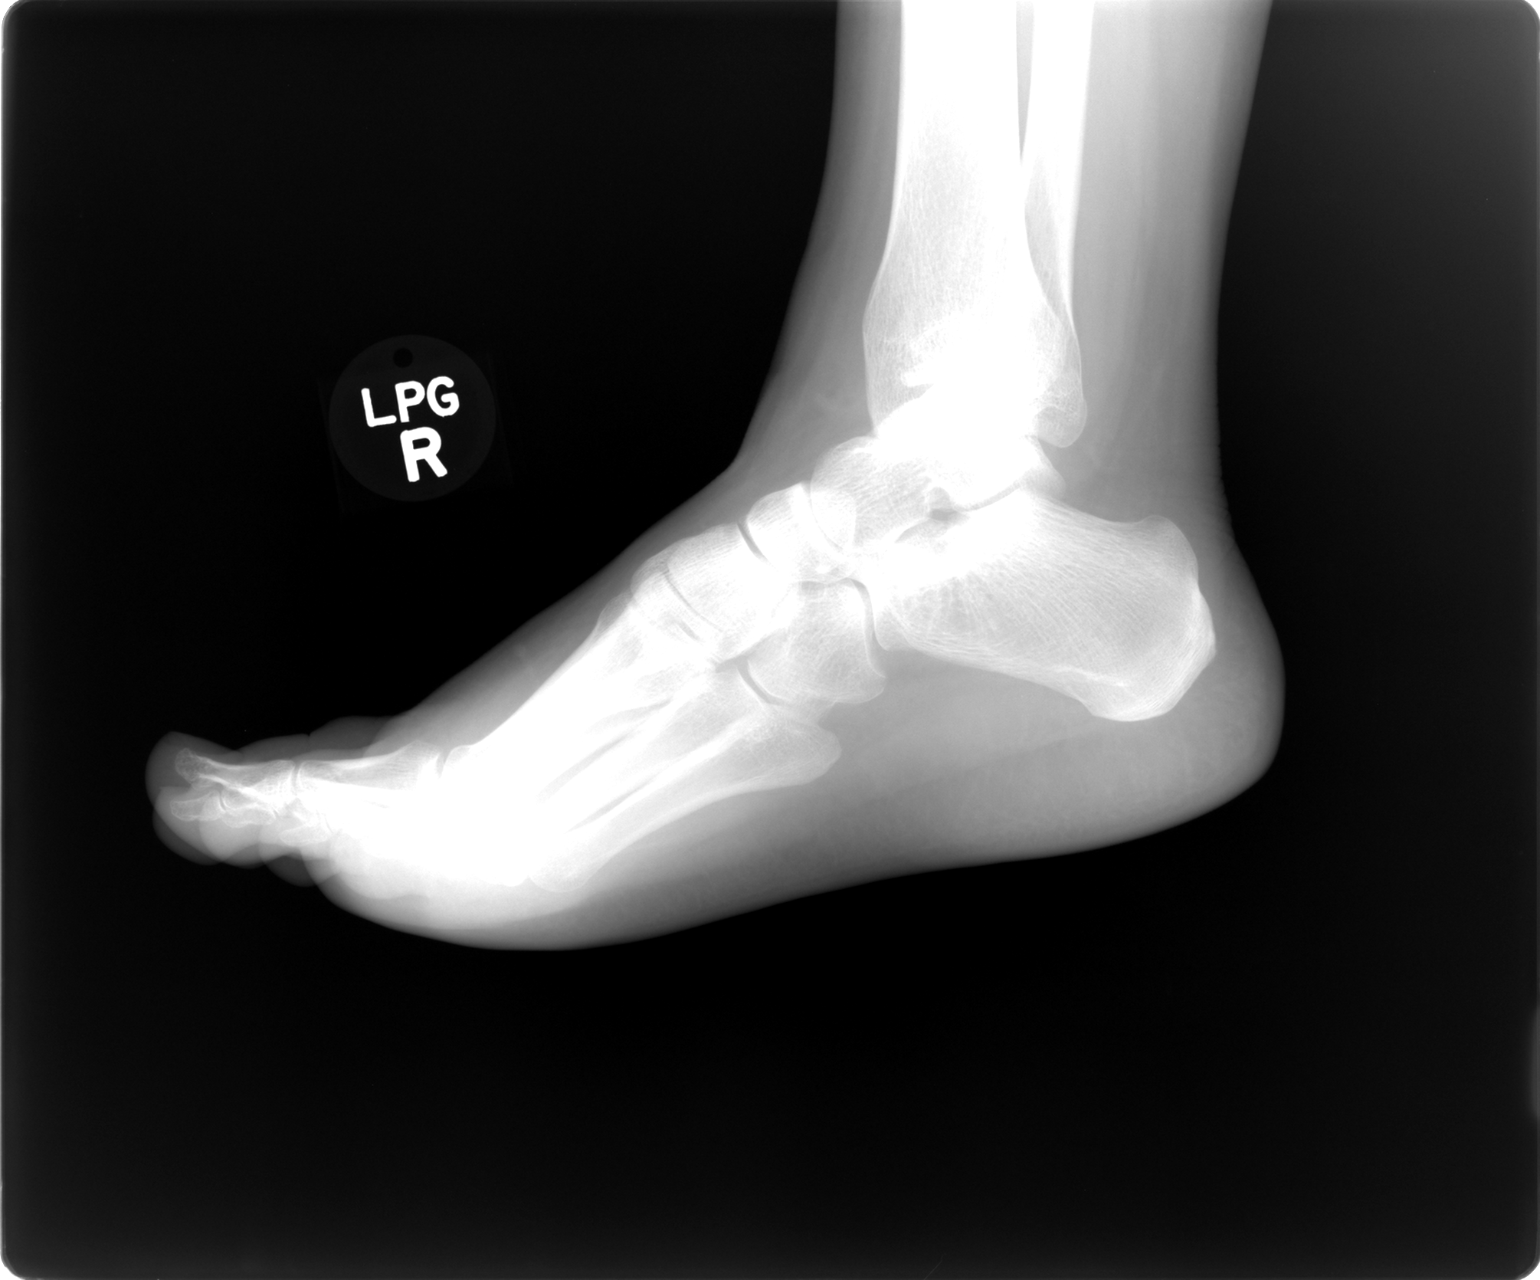

[3 of 3 positions shown; findings below may reference images not displayed]

FINDINGS: Frontal, oblique, and lateral views were obtained. There is soft
tissue swelling dorsally. There is no evident fracture or
dislocation. Joint spaces appear normal. No erosive change.
IMPRESSION: Soft tissue swelling dorsally. No fracture or dislocation. No
evident arthropathy.

## 2018-09-07 DIAGNOSIS — Z6826 Body mass index (BMI) 26.0-26.9, adult: Secondary | ICD-10-CM | POA: Diagnosis not present

## 2018-09-07 DIAGNOSIS — Z1389 Encounter for screening for other disorder: Secondary | ICD-10-CM | POA: Diagnosis not present

## 2018-09-07 DIAGNOSIS — Z0000 Encounter for general adult medical examination without abnormal findings: Secondary | ICD-10-CM | POA: Diagnosis not present

## 2018-09-07 DIAGNOSIS — Z975 Presence of (intrauterine) contraceptive device: Secondary | ICD-10-CM | POA: Diagnosis not present

## 2018-09-07 DIAGNOSIS — Z130 Encounter for screening for diseases of the blood and blood-forming organs and certain disorders involving the immune mechanism: Secondary | ICD-10-CM | POA: Diagnosis not present

## 2018-09-07 DIAGNOSIS — Z01419 Encounter for gynecological examination (general) (routine) without abnormal findings: Secondary | ICD-10-CM | POA: Diagnosis not present

## 2018-11-30 DIAGNOSIS — H5213 Myopia, bilateral: Secondary | ICD-10-CM | POA: Diagnosis not present

## 2018-12-09 MED FILL — metroNIDAZOLE 500 MG TABS: 500 | 7 days supply | Qty: 14 | Fill #0

## 2018-12-09 MED FILL — metroNIDAZOLE 0.75 % GEL: 0.75 | 30 days supply | Qty: 70 | Fill #0

## 2019-04-20 DIAGNOSIS — L723 Sebaceous cyst: Secondary | ICD-10-CM | POA: Diagnosis not present

## 2019-08-02 DIAGNOSIS — N760 Acute vaginitis: Secondary | ICD-10-CM | POA: Diagnosis not present

## 2019-08-02 DIAGNOSIS — N898 Other specified noninflammatory disorders of vagina: Secondary | ICD-10-CM | POA: Diagnosis not present

## 2019-08-02 DIAGNOSIS — B373 Candidiasis of vulva and vagina: Secondary | ICD-10-CM | POA: Diagnosis not present

## 2019-08-02 MED FILL — FLUCONAZOLE 150 MG TABS: 150 | 3 days supply | Qty: 2 | Fill #0

## 2019-08-02 MED FILL — metroNIDAZOLE 0.75 % GEL: 0.75 | 30 days supply | Qty: 70 | Fill #0

## 2019-11-30 DIAGNOSIS — H5213 Myopia, bilateral: Secondary | ICD-10-CM | POA: Diagnosis not present

## 2019-12-26 ENCOUNTER — Other Ambulatory Visit: Payer: Self-pay

## 2019-12-26 ENCOUNTER — Ambulatory Visit
Admission: RE | Admit: 2019-12-26 | Discharge: 2019-12-26 | Disposition: A | Discharge status: Home / Self Care | Payer: 59 | Source: Ambulatory Visit | Attending: Emergency Medicine | Admitting: Emergency Medicine

## 2019-12-26 VITALS — BP 129/85 | HR 60 | Temp 98.4°F | Resp 20

## 2019-12-26 DIAGNOSIS — R519 Headache, unspecified: Secondary | ICD-10-CM | POA: Diagnosis not present

## 2019-12-26 DIAGNOSIS — H539 Unspecified visual disturbance: Secondary | ICD-10-CM

## 2019-12-26 MED ORDER — DEXAMETHASONE SODIUM PHOSPHATE 10 MG/ML IJ SOLN
10.0000 mg | Freq: Once | INTRAMUSCULAR | Status: AC
  Administered 2019-12-26: 10 mg via INTRAMUSCULAR

## 2019-12-26 MED ORDER — KETOROLAC TROMETHAMINE 30 MG/ML IJ SOLN
30.0000 mg | Freq: Once | INTRAMUSCULAR | Status: AC
  Administered 2019-12-26: 30 mg via INTRAMUSCULAR

## 2019-12-26 NOTE — ED Provider Notes (Signed)
[facility]   [id number] 12/26/19 Arrival Time: 1813  [id number]  SUBJECTIVE:  Wendy Harding is a 39 y.o. female who complains of migraine that began last night, now mostly resolved.  Denies a precipitating event, or recent head trauma.  Patient localizes her pain to the to behind left eye.  Describes the pain as intermittent.  Denies alleviating or aggravating factors.  Reports similar headache in the past with visual disturbance.  This is not the worst headache of their life.  Complains of partial vision loss in LT eye that has returned.  Patient denies fever, chills, nausea, vomiting, aura, rhinorrhea, watery eyes, chest pain, SOB, abdominal pain, weakness, numbness or tingling, slurred speech.     ROS: As per HPI.  All other pertinent ROS negative.     Past Medical History:  Diagnosis Date  . AMA (advanced maternal age) multigravida 35+   . Infection    yeast infection  . Vaginal Pap smear, abnormal    colpo, ok since   Past Surgical History:  Procedure Laterality Date  . CESAREAN SECTION N/A 11/15/2014   Procedure: CESAREAN SECTION;  Surgeon: Lavina Hamman, MD;  Location: [facility];  Service: Obstetrics;  Laterality: N/A;  . CESAREAN SECTION N/A 07/09/2017   Procedure: REPEAT CESAREAN SECTION;  Surgeon: Huel Cote, MD;  Location: [facility]IRTHING SUITES;  Service: Obstetrics;  Laterality: N/A;  Heather, RNFA  . COLPOSCOPY    . WISDOM TOOTH EXTRACTION     No Known Allergies No current facility-administered medications on file prior to encounter.   Current Outpatient Medications on File Prior to Encounter  Medication Sig Dispense Refill  . acetaminophen (TYLENOL) 500 MG tablet Take 500 mg by mouth daily as needed for moderate pain or headache.    . calcium carbonate (TUMS - DOSED IN MG ELEMENTAL CALCIUM) 500 MG chewable tablet Chew 1-2 tablets by mouth daily as needed for indigestion or heartburn.    Marland Kitchen ibuprofen (ADVIL,MOTRIN) 600 MG tablet Take 1 tablet (600 mg  total) by mouth every 6 (six) hours. 30 tablet 0  . ibuprofen (ADVIL,MOTRIN) 600 MG tablet Take 1 tablet (600 mg total) by mouth every 6 (six) hours as needed. 30 tablet 0  . Polyethyl Glycol-Propyl Glycol (SYSTANE OP) Place 1 drop into both eyes daily as needed (dry eyes).    . [DISCONTINUED] ranitidine (ZANTAC) 150 MG tablet Take 150 mg by mouth 2 (two) times daily as needed for heartburn.     Social History   Socioeconomic History  . Marital status: Married    Spouse name: Not on file  . Number of children: Not on file  . Years of education: Not on file  . Highest education level: Not on file  Occupational History  . Not on file  Tobacco Use  . Smoking status: Former Smoker    Types: Cigarettes  . Smokeless tobacco: Never Used  . Tobacco comment: 2014  Substance and Sexual Activity  . Alcohol use: Yes    Comment: none with preg  . Drug use: No  . Sexual activity: Yes  Other Topics Concern  . Not on file  Social History Narrative  . Not on file   Social Determinants of Health   Financial Resource Strain:   . Difficulty of Paying Living Expenses: Not on file  Food Insecurity:   . Worried About Programme researcher, broadcasting/film/video in the Last Year: Not on file  . Ran Out of Food in the Last Year: Not on file  Transportation  Needs:   . Lack of Transportation (Medical): Not on file  . Lack of Transportation (Non-Medical): Not on file  Physical Activity:   . Days of Exercise per Week: Not on file  . Minutes of Exercise per Session: Not on file  Stress:   . Feeling of Stress : Not on file  Social Connections:   . Frequency of Communication with Friends and Family: Not on file  . Frequency of Social Gatherings with Friends and Family: Not on file  . Attends Religious Services: Not on file  . Active Member of Clubs or Organizations: Not on file  . Attends Banker Meetings: Not on file  . Marital Status: Not on file  Intimate Partner Violence:   . Fear of Current or  Ex-Partner: Not on file  . Emotionally Abused: Not on file  . Physically Abused: Not on file  . Sexually Abused: Not on file   Family History  Problem Relation Age of Onset  . Asthma Mother   . Sleep apnea Mother   . Heart disease Maternal Grandmother   . Heart failure Maternal Grandmother   . Heart disease Maternal Grandfather     OBJECTIVE:  Vitals:   12/26/19 1826  BP: 129/85  Pulse: 60  Resp: 20  Temp: 98.4 F (36.9 C)  SpO2: 98%    General appearance: alert; no distress Eyes: PERRLA; EOMI HENT: normocephalic; atraumatic; LT eye blinking slower than RT; nares patent; oropharynx clear Neck: supple with FROM Lungs: clear to auscultation bilaterally Heart: regular rate and rhythm.   Extremities: no edema; symmetrical with no gross deformities Skin: warm and dry Neurologic: CN 2-12 grossly intact; finger to nose without difficulty; normal gait; strength and sensation intact bilaterally about the upper and lower extremities; negative pronator drift Psychological: alert and cooperative; normal mood and affect   ASSESSMENT & PLAN:  1. Acute nonintractable headache, unspecified headache type   2. Visual disturbance     Meds ordered this encounter  Medications  . ketorolac (TORADOL) 30 MG/ML injection 30 mg  . dexamethasone (DECADRON) injection 10 mg   Unable to rule out stroke or damage to retina in urgent care setting.  Offered patient further evaluation and management in the ED.  Patient declines at this time and would like to try outpatient therapy first.  Aware of the risk associated with this decision including missed diagnosis, organ damage, organ failure, and/or death.  Patient aware and in agreement.     Migraine cocktail given in office Rest and drink plenty of fluids Use OTC medications as needed for symptomatic relief Follow up with PCP Return or go to the ER if you have any new or worsening symptoms such as fever, chills, nausea, vomiting, chest pain,  shortness of breath, cough, vision changes, worsening headache despite treatment, slurred speech, facial asymmetry, weakness in arms or legs, etc...  Reviewed expectations re: course of current medical issues. Questions answered. Outlined signs and symptoms indicating need for more acute intervention. Patient verbalized understanding. After Visit Summary given.   Rennis Harding, PA-C 12/26/19 1844

## 2019-12-26 NOTE — Discharge Instructions (Signed)
Unable to rule out stroke or damage to retina in urgent care setting.  Offered patient further evaluation and management in the ED.  Patient declines at this time and would like to try outpatient therapy first.  Aware of the risk associated with this decision including missed diagnosis, organ damage, organ failure, and/or death.  Patient aware and in agreement.     Migraine cocktail given in office Rest and drink plenty of fluids Use OTC medications as needed for symptomatic relief Follow up with PCP Return or go to the ER if you have any new or worsening symptoms such as fever, chills, nausea, vomiting, chest pain, shortness of breath, cough, vision changes, worsening headache despite treatment, slurred speech, facial asymmetry, weakness in arms or legs, etc..Marland Kitchen

## 2019-12-26 NOTE — ED Triage Notes (Signed)
Pt presents with c/o headache that began last night and pat experienced partial  vision loss in left eye that has since returned

## 2020-01-10 DIAGNOSIS — N760 Acute vaginitis: Secondary | ICD-10-CM | POA: Diagnosis not present

## 2020-01-10 DIAGNOSIS — N898 Other specified noninflammatory disorders of vagina: Secondary | ICD-10-CM | POA: Diagnosis not present

## 2020-01-10 DIAGNOSIS — B373 Candidiasis of vulva and vagina: Secondary | ICD-10-CM | POA: Diagnosis not present

## 2020-01-10 DIAGNOSIS — R300 Dysuria: Secondary | ICD-10-CM | POA: Diagnosis not present

## 2020-01-17 DIAGNOSIS — Z975 Presence of (intrauterine) contraceptive device: Secondary | ICD-10-CM | POA: Diagnosis not present

## 2020-01-17 DIAGNOSIS — Z1389 Encounter for screening for other disorder: Secondary | ICD-10-CM | POA: Diagnosis not present

## 2020-01-17 DIAGNOSIS — Z130 Encounter for screening for diseases of the blood and blood-forming organs and certain disorders involving the immune mechanism: Secondary | ICD-10-CM | POA: Diagnosis not present

## 2020-01-17 DIAGNOSIS — Z124 Encounter for screening for malignant neoplasm of cervix: Secondary | ICD-10-CM | POA: Diagnosis not present

## 2020-01-17 DIAGNOSIS — Z6824 Body mass index (BMI) 24.0-24.9, adult: Secondary | ICD-10-CM | POA: Diagnosis not present

## 2020-01-17 DIAGNOSIS — Z01419 Encounter for gynecological examination (general) (routine) without abnormal findings: Secondary | ICD-10-CM | POA: Diagnosis not present

## 2020-01-18 DIAGNOSIS — Z124 Encounter for screening for malignant neoplasm of cervix: Secondary | ICD-10-CM | POA: Diagnosis not present

## 2020-04-22 ENCOUNTER — Ambulatory Visit
Admission: EM | Admit: 2020-04-22 | Discharge: 2020-04-22 | Disposition: A | Discharge status: Home / Self Care | Payer: 59 | Attending: Physician Assistant | Admitting: Physician Assistant

## 2020-04-22 ENCOUNTER — Other Ambulatory Visit: Payer: Self-pay

## 2020-04-22 ENCOUNTER — Encounter: Payer: Self-pay | Admitting: Emergency Medicine

## 2020-04-22 ENCOUNTER — Ambulatory Visit: Payer: Self-pay

## 2020-04-22 DIAGNOSIS — H6693 Otitis media, unspecified, bilateral: Secondary | ICD-10-CM | POA: Diagnosis not present

## 2020-04-22 MED ORDER — AMOXICILLIN 500 MG PO CAPS: 500.0000 mg | ORAL_CAPSULE | Freq: Three times a day (TID) | ORAL | 0 refills | Status: AC

## 2020-04-22 NOTE — Discharge Instructions (Signed)
Tylenol for fever.  See your Physician for recheck in one week if symptoms persist

## 2020-04-22 NOTE — ED Provider Notes (Signed)
[facility]    CSN: [id number] Arrival date & time: 04/22/20  1055      History   Chief Complaint No chief complaint on file.   HPI Wendy Harding is a 40 y.o. female.   Pt complains of right ear pain.  Pt complains of a sore throat.  Pt's son has had an ear ache.   The history is provided by the patient. No language interpreter was used.    Past Medical History:  Diagnosis Date  . AMA (advanced maternal age) multigravida 35+   . Infection    yeast infection  . Vaginal Pap smear, abnormal    colpo, ok since    Patient Active Problem List   Diagnosis Date Noted  . History of cesarean section 07/09/2017  . Status post repeat low transverse cesarean section 07/09/2017  . Fetal heart rate deceleration, delivered, current hospitalization 11/15/2014  . S/P cesarean section 11/15/2014    Past Surgical History:  Procedure Laterality Date  . CESAREAN SECTION N/A 11/15/2014   Procedure: CESAREAN SECTION;  Surgeon: Lavina Hamman, MD;  Location: [facility];  Service: Obstetrics;  Laterality: N/A;  . CESAREAN SECTION N/A 07/09/2017   Procedure: REPEAT CESAREAN SECTION;  Surgeon: Huel Cote, MD;  Location: [facility]IRTH[facility] SU[facility];  Service: Obstetrics;  Laterality: N/A;  Heather, RNFA  . COLPOSCOPY    . WISDOM TOOTH EXTRACTION      OB History    Gravida  2   Para  2   Term  2   Preterm      AB      Living  2     SAB      IAB      Ectopic      Multiple  0   Live Births  2            Home Medications    Prior to Admission medications   Medication Sig Start Date End Date Taking? Authorizing Provider  amoxicillin (AMOXIL) 500 MG capsule Take 1 capsule (500 mg total) by mouth 3 (three) times daily. 04/22/20  Yes Elson Areas, PA-C  acetaminophen (TYLENOL) 500 MG tablet Take 500 mg by mouth daily as needed for moderate pain or headache.    [provider]  calcium carbonate (TUMS - DOSED IN MG ELEMENTAL CALCIUM) 500 MG chewable  tablet Chew 1-2 tablets by mouth daily as needed for indigestion or heartburn.    [provider]  ibuprofen (ADVIL,MOTRIN) 600 MG tablet Take 1 tablet (600 mg total) by mouth every 6 (six) hours. 07/11/17   Meisinger, Tawanna Cooler, MD  ibuprofen (ADVIL,MOTRIN) 600 MG tablet Take 1 tablet (600 mg total) by mouth every 6 (six) hours as needed. 07/11/17   Meisinger, Todd, MD  Polyethyl Glycol-Propyl Glycol (SYSTANE OP) Place 1 drop into both eyes daily as needed (dry eyes).    [provider]  ranitidine (ZANTAC) 150 MG tablet Take 150 mg by mouth 2 (two) times daily as needed for heartburn.  12/26/19  [provider]    Family History Family History  Problem Relation Age of Onset  . Asthma Mother   . Sleep apnea Mother   . Heart disease Maternal Grandmother   . Heart failure Maternal Grandmother   . Heart disease Maternal Grandfather     Social History Social History   Tobacco Use  . Smoking status: Former Smoker    Types: Cigarettes  . Smokeless tobacco: Never Used  . Tobacco comment: 2014  Substance Use Topics  . Alcohol use: Yes    Comment: none with preg  . Drug use: No     Allergies   Patient has no known allergies.   Review of Systems Review of Systems  All other systems reviewed and are negative.    Physical Exam Triage Vital Signs ED Triage Vitals  Enc Vitals Group     BP 04/22/20 1110 111/70     Pulse Rate 04/22/20 1110 93     Resp 04/22/20 1110 18     Temp 04/22/20 1110 98.6 F (37 C)     Temp Source 04/22/20 1110 Oral     SpO2 04/22/20 1110 98 %     Weight --      Height --      Head Circumference --      Peak Flow --      Pain Score 04/22/20 1111 3     Pain Loc --      Pain Edu? --      Excl. in GC? --    No data found.  Updated Vital Signs BP 111/70 (BP Location: Right Arm)   Pulse 93   Temp 98.6 F (37 C) (Oral)   Resp 18   SpO2 98%   Visual Acuity Right Eye Distance:   Left Eye Distance:   Bilateral Distance:     Right Eye Near:   Left Eye Near:    Bilateral Near:     Physical Exam Vitals reviewed.  HENT:     Head: Normocephalic.     Ears:     Comments: bilat tm's erythematous      Mouth/Throat:     Pharynx: Posterior oropharyngeal erythema present.  Eyes:     Pupils: Pupils are equal, round, and reactive to light.  Cardiovascular:     Rate and Rhythm: Normal rate and regular rhythm.     Pulses: Normal pulses.     Heart sounds: Normal heart sounds.  Pulmonary:     Effort: Pulmonary effort is normal.     Breath sounds: Normal breath sounds.  Abdominal:     General: Abdomen is flat.  Musculoskeletal:        General: Normal range of motion.  Skin:    General: Skin is warm.  Neurological:     General: No focal deficit present.     Mental Status: She is alert.  Psychiatric:        Mood and Affect: Mood normal.      UC Treatments / Results  Labs (all labs ordered are listed, but only abnormal results are displayed) Labs Reviewed - No data to display  EKG   Radiology No results found.  Procedures Procedures (including critical care time)  Medications Ordered in UC Medications - No data to display  Initial Impression / Assessment and Plan / UC Course  I have reviewed the triage vital signs and the nursing notes.  Pertinent labs & imaging results that were available during my care of the patient were reviewed by me and considered in my medical decision making (see chart for details).     MDM:  Pt counseled on ear infections.  Final Clinical Impressions(s) / UC Diagnoses   Final diagnoses:  Bilateral otitis media, unspecified otitis media type     Discharge Instructions     Tylenol for fever.  See your Physician for recheck in one week if symptoms persist   ED Prescriptions    Medication Sig Dispense Auth. Provider  amoxicillin (AMOXIL) 500 MG capsule Take 1 capsule (500 mg total) by mouth 3 (three) times daily. 30 capsule Elson Areas, [place]     PDMP  not reviewed this encounter.  An After Visit Summary was printed and given to the patient.    Elson Areas, [place] 04/22/20 1756

## 2020-04-22 NOTE — ED Triage Notes (Signed)
Left ear pain since yesterday.  Nasal congestion, headache.

## 2020-11-20 DIAGNOSIS — N898 Other specified noninflammatory disorders of vagina: Secondary | ICD-10-CM | POA: Diagnosis not present

## 2020-11-20 DIAGNOSIS — N760 Acute vaginitis: Secondary | ICD-10-CM | POA: Diagnosis not present

## 2020-11-20 DIAGNOSIS — R350 Frequency of micturition: Secondary | ICD-10-CM | POA: Diagnosis not present

## 2021-02-10 DIAGNOSIS — Z130 Encounter for screening for diseases of the blood and blood-forming organs and certain disorders involving the immune mechanism: Secondary | ICD-10-CM | POA: Diagnosis not present

## 2021-02-10 DIAGNOSIS — Z01419 Encounter for gynecological examination (general) (routine) without abnormal findings: Secondary | ICD-10-CM | POA: Diagnosis not present

## 2021-02-10 DIAGNOSIS — Z6824 Body mass index (BMI) 24.0-24.9, adult: Secondary | ICD-10-CM | POA: Diagnosis not present

## 2021-02-10 DIAGNOSIS — Z1389 Encounter for screening for other disorder: Secondary | ICD-10-CM | POA: Diagnosis not present

## 2021-06-05 DIAGNOSIS — N760 Acute vaginitis: Secondary | ICD-10-CM | POA: Diagnosis not present

## 2021-06-05 DIAGNOSIS — N898 Other specified noninflammatory disorders of vagina: Secondary | ICD-10-CM | POA: Diagnosis not present

## 2021-06-05 DIAGNOSIS — R350 Frequency of micturition: Secondary | ICD-10-CM | POA: Diagnosis not present

## 2022-03-25 DIAGNOSIS — N760 Acute vaginitis: Secondary | ICD-10-CM | POA: Diagnosis not present

## 2022-03-25 DIAGNOSIS — N898 Other specified noninflammatory disorders of vagina: Secondary | ICD-10-CM | POA: Diagnosis not present

## 2023-05-07 DIAGNOSIS — N9089 Other specified noninflammatory disorders of vulva and perineum: Secondary | ICD-10-CM | POA: Diagnosis not present

## 2023-05-07 DIAGNOSIS — Z30431 Encounter for routine checking of intrauterine contraceptive device: Secondary | ICD-10-CM | POA: Diagnosis not present

## 2023-05-07 DIAGNOSIS — B3731 Acute candidiasis of vulva and vagina: Secondary | ICD-10-CM | POA: Diagnosis not present
